# Patient Record
Sex: Female | Born: 1990 | Race: Black or African American | Hispanic: No | Marital: Single | Smoking: Never smoker
Health system: Southern US, Community
[De-identification: ages and names within clinical notes are randomized; demographics above are authoritative.]

## PROBLEM LIST (undated history)

## (undated) DIAGNOSIS — E119 Type 2 diabetes mellitus without complications: Secondary | ICD-10-CM

## (undated) DIAGNOSIS — R519 Headache, unspecified: Secondary | ICD-10-CM

## (undated) HISTORY — PX: NO PAST SURGERIES: SHX2092

---

## 2013-02-28 ENCOUNTER — Encounter: Payer: Self-pay | Admitting: Emergency Medicine

## 2013-02-28 ENCOUNTER — Emergency Department (INDEPENDENT_AMBULATORY_CARE_PROVIDER_SITE_OTHER)
Admission: EM | Admit: 2013-02-28 | Discharge: 2013-02-28 | Disposition: A | Discharge status: Home / Self Care | Payer: PRIVATE HEALTH INSURANCE | Source: Home / Self Care | Attending: Family Medicine | Admitting: Family Medicine

## 2013-02-28 DIAGNOSIS — L230 Allergic contact dermatitis due to metals: Secondary | ICD-10-CM

## 2013-02-28 MED ORDER — CLOTRIMAZOLE 1 % EX CREA
TOPICAL_CREAM | CUTANEOUS | Status: DC
Start: 2013-02-28 — End: 2018-10-10

## 2013-02-28 MED ORDER — TRIAMCINOLONE ACETONIDE 0.1 % EX CREA: TOPICAL_CREAM | CUTANEOUS | Status: DC

## 2013-02-28 MED ORDER — PREDNISONE 20 MG PO TABS: 20.0000 mg | ORAL_TABLET | Freq: Two times a day (BID) | ORAL | Status: DC

## 2013-02-28 NOTE — ED Provider Notes (Signed)
CSN: [id number]     Arrival date & time 02/28/13  1645 History   First MD Initiated Contact with Patient 02/28/13 1707     Chief Complaint  Patient presents with  . Rash        HPI Comments: Patient complains of a two day history of a scaly pruritic rash on her neck bilaterally.  She also complains of erythema of her eyelids and underneath eyelids without pain or swelling.  Patient is a 23 y.o. female presenting with rash. The history is provided by the patient.  Rash Pain location: neck and eyelids. Pain quality comment:  Itching Pain severity:  Mild Onset quality:  Gradual Duration:  2 days Timing:  Constant Progression:  Worsening Chronicity:  New Relieved by:  Nothing Worsened by:  Nothing tried Ineffective treatments:  None tried Associated symptoms: no chills and no sore throat     History reviewed. No pertinent past medical history. History reviewed. No pertinent past surgical history. Family History  Problem Relation Age of Onset  . Diabetes Maternal Uncle   . Diabetes Paternal Uncle    History  Substance Use Topics  . Smoking status: Never Smoker   . Smokeless tobacco: Not on file  . Alcohol Use: No   OB History   Grav Para Term Preterm Abortions TAB SAB Ect Mult Living                 Review of Systems  Constitutional: Negative for chills.  HENT: Negative for sore throat.   Skin: Positive for rash.      Allergies  Review of patient's allergies indicates no known allergies.  Home Medications   Current Outpatient Rx  Name  Route  Sig  Dispense  Refill  . clotrimazole (LOTRIMIN) 1 % cream      Apply thin film to eye lids BID   15 g   1   . predniSONE (DELTASONE) 20 MG tablet   Oral   Take 1 tablet (20 mg total) by mouth 2 (two) times daily. Take with food.   10 tablet   0   . triamcinolone cream (KENALOG) 0.1 %      Apply thin film to neck twice daily   30 g   1    BP 121/84  Pulse 79  Temp(Src) 98 F (36.7 C) (Oral)  Resp 16   Ht 5\' 1"  (1.549 m)  Wt 243 lb (110.224 kg)  BMI 45.94 kg/m2  SpO2 98%  LMP 02/03/2013 Physical Exam  Nursing note and vitals reviewed. Constitutional: She is oriented to person, place, and time. She appears well-developed and well-nourished. No distress.  Patient is obese (BMI 45.9)  HENT:  Head: Normocephalic and atraumatic.  Right Ear: External ear normal.  Left Ear: External ear normal.  Mouth/Throat: Oropharynx is clear and moist.  Eyes: Conjunctivae and EOM are normal. Pupils are equal, round, and reactive to light. Right eye exhibits no discharge. Left eye exhibits no discharge.    Upper and lower eyelids are slightly erythematous without swelling or tenderness.  Lower eyelids reveal an intertriginous skin fold that is moist and erythematous  Neck:    Neck reveals erythema, scaling, and hyperkeratosis in a necklace distribution as noted diagram.  Patient is wearing a gold colored chain necklace.  Cardiovascular: Normal heart sounds.   Pulmonary/Chest: Breath sounds normal.  Lymphadenopathy:    She has no cervical adenopathy.  Neurological: She is alert and oriented to person, place, and time.  Skin: Skin is  warm and dry. Rash noted.    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  Dermatitis on eyelids is suggestive of Candida     Begin prednisone burst.  Triamcinolone Cream to eruption on neck.  Clotrimazole cream to eyelids. Discontinue using necklace until resolved, then consider using necklace not containing nickle. Followup with dermatologist if not improving   Lattie HawStephen A Beese, MD 03/01/13 2235

## 2013-02-28 NOTE — ED Notes (Signed)
Reports onset of rash on face and around neck 2 days ago; no new allergens accounted for. Some itching and some pain at back of neck.

## 2013-02-28 NOTE — Discharge Instructions (Signed)
Discontinue using necklace until resolved, then consider using necklace not containing nickle.   Contact Dermatitis Contact dermatitis is a rash that happens when something touches the skin. You touched something that irritates your skin, or you have allergies to something you touched. HOME CARE   Avoid the thing that caused your rash.  Keep your rash away from hot water, soap, sunlight, chemicals, and other things that might bother it.  Do not scratch your rash.  You can take cool baths to help stop itching.  Only take medicine as told by your doctor.  Keep all doctor visits as told. GET HELP RIGHT AWAY IF:   Your rash is not better after 3 days.  Your rash gets worse.  Your rash is puffy (swollen), tender, red, sore, or warm.  You have problems with your medicine. MAKE SURE YOU:   Understand these instructions.  Will watch your condition.  Will get help right away if you are not doing well or get worse. Document Released: 10/29/2008 Document Revised: 03/26/2011 Document Reviewed: 06/06/2010 [facility] Patient Information 2014 [employer], [place].

## 2018-10-10 ENCOUNTER — Inpatient Hospital Stay (HOSPITAL_COMMUNITY)
Admission: AD | Admit: 2018-10-10 | Discharge: 2018-10-10 | Disposition: A | Discharge status: Home / Self Care | Payer: Medicaid Other | Attending: Obstetrics and Gynecology | Admitting: Obstetrics and Gynecology

## 2018-10-10 ENCOUNTER — Encounter (HOSPITAL_COMMUNITY): Payer: Self-pay | Admitting: *Deleted

## 2018-10-10 ENCOUNTER — Other Ambulatory Visit: Payer: Self-pay

## 2018-10-10 ENCOUNTER — Other Ambulatory Visit: Payer: Self-pay | Admitting: Certified Nurse Midwife

## 2018-10-10 DIAGNOSIS — Z833 Family history of diabetes mellitus: Secondary | ICD-10-CM | POA: Insufficient documentation

## 2018-10-10 DIAGNOSIS — R109 Unspecified abdominal pain: Secondary | ICD-10-CM | POA: Diagnosis not present

## 2018-10-10 DIAGNOSIS — Z3A10 10 weeks gestation of pregnancy: Secondary | ICD-10-CM | POA: Diagnosis not present

## 2018-10-10 DIAGNOSIS — O26891 Other specified pregnancy related conditions, first trimester: Secondary | ICD-10-CM | POA: Diagnosis not present

## 2018-10-10 DIAGNOSIS — O4690 Antepartum hemorrhage, unspecified, unspecified trimester: Secondary | ICD-10-CM

## 2018-10-10 DIAGNOSIS — O26899 Other specified pregnancy related conditions, unspecified trimester: Secondary | ICD-10-CM

## 2018-10-10 DIAGNOSIS — O360910 Maternal care for other rhesus isoimmunization, first trimester, not applicable or unspecified: Secondary | ICD-10-CM

## 2018-10-10 DIAGNOSIS — O4691 Antepartum hemorrhage, unspecified, first trimester: Secondary | ICD-10-CM | POA: Diagnosis not present

## 2018-10-10 DIAGNOSIS — N841 Polyp of cervix uteri: Secondary | ICD-10-CM | POA: Diagnosis not present

## 2018-10-10 DIAGNOSIS — Z6790 Unspecified blood type, Rh positive: Secondary | ICD-10-CM | POA: Diagnosis not present

## 2018-10-10 DIAGNOSIS — O3441 Maternal care for other abnormalities of cervix, first trimester: Secondary | ICD-10-CM | POA: Diagnosis not present

## 2018-10-10 HISTORY — DX: Headache, unspecified: R51.9

## 2018-10-10 LAB — URINALYSIS, ROUTINE W REFLEX MICROSCOPIC
Bilirubin Urine: NEGATIVE
Glucose, UA: NEGATIVE mg/dL
Ketones, ur: 20 mg/dL — AB
Nitrite: NEGATIVE
Protein, ur: 30 mg/dL — AB
Specific Gravity, Urine: 1.02 (ref 1.005–1.030)
pH: 7 (ref 5.0–8.0)

## 2018-10-10 LAB — WET PREP, GENITAL
Clue Cells Wet Prep HPF POC: NONE SEEN
Sperm: NONE SEEN
Trich, Wet Prep: NONE SEEN
Yeast Wet Prep HPF POC: NONE SEEN

## 2018-10-10 LAB — CBC
HCT: 37.5 % (ref 36.0–46.0)
Hemoglobin: 12.5 g/dL (ref 12.0–15.0)
MCH: 28.8 pg (ref 26.0–34.0)
MCHC: 33.3 g/dL (ref 30.0–36.0)
MCV: 86.4 fL (ref 80.0–100.0)
Platelets: 245 10*3/uL (ref 150–400)
RBC: 4.34 MIL/uL (ref 3.87–5.11)
RDW: 13 % (ref 11.5–15.5)
WBC: 6.7 10*3/uL (ref 4.0–10.5)
nRBC: 0 % (ref 0.0–0.2)

## 2018-10-10 LAB — ABO/RH: ABO/RH(D): O POS

## 2018-10-10 MED ORDER — ACETAMINOPHEN 500 MG PO TABS: 1000.0000 mg | ORAL_TABLET | Freq: Four times a day (QID) | ORAL | Status: DC | PRN

## 2018-10-10 NOTE — MAU Provider Note (Addendum)
History     CSN: [id number]  Arrival date and time: 10/10/18 1054   First Provider Initiated Contact with Patient 10/10/18 1132      Chief Complaint  Patient presents with  . Vaginal Bleeding  . Abdominal Pain   28 y.o. G1 @10 .3 wks presenting with VB. Reports onset around 3am when she got up to BR. She noticed blood streaks in her underwear. Bleeding has not been enough to require a pad. No recent sex. Denies vaginal discharge, itching, or odor. Also reports sharp, constant, bilateral low abd pain since 3am. Rates 5/10. Denies urinary sx. Had constipation and vomiting yesterday. Morning sickness had been ongoing for her.   OB History    Gravida  1   Para      Term      Preterm      AB      Living        SAB      TAB      Ectopic      Multiple      Live Births              Past Medical History:  Diagnosis Date  . Headache    Migraines    Past Surgical History:  Procedure Laterality Date  . NO PAST SURGERIES      Family History  Problem Relation Age of Onset  . Diabetes Maternal Uncle   . Diabetes Paternal Uncle     Social History   Tobacco Use  . Smoking status: Never Smoker  . Smokeless tobacco: Never Used  Substance Use Topics  . Alcohol use: No  . Drug use: No    Allergies: No Known Allergies  No medications prior to admission.    Review of Systems  Constitutional: Negative for chills and fever.  Gastrointestinal: Positive for abdominal pain, constipation, nausea and vomiting. Negative for diarrhea.  Genitourinary: Positive for vaginal bleeding. Negative for dysuria, frequency, urgency and vaginal discharge.   Physical Exam   Blood pressure 116/68, pulse 88, temperature 98.9 F (37.2 C), temperature source Oral, resp. rate 17, height 5\' 3"  (1.6 m), weight 102.1 kg, SpO2 100 %.  Physical Exam  Nursing note and vitals reviewed. Constitutional: She appears well-developed and well-nourished. No distress (appears comfortable).   HENT:  Head: Normocephalic and atraumatic.  Neck: Normal range of motion.  Cardiovascular: Normal rate.  Respiratory: Effort normal.  GI: Soft. She exhibits no distension and no mass. There is no abdominal tenderness. There is no rebound and no guarding.  Genitourinary:    Genitourinary Comments: External: no lesions or erythema Vagina: rugated, pink, moist, scant pink discharge Cervix closed/long; polyp @ 9 o'clock    FHT 172   Results for orders placed or performed during the hospital encounter of 10/10/18 (from the past 24 hour(s))  Urinalysis, Routine w reflex microscopic     Status: Abnormal   Collection Time: 10/10/18 11:23 AM  Result Value Ref Range   Color, Urine YELLOW YELLOW   APPearance CLOUDY (A) CLEAR   Specific Gravity, Urine 1.020 1.005 - 1.030   pH 7.0 5.0 - 8.0   Glucose, UA NEGATIVE NEGATIVE mg/dL   Hgb urine dipstick MODERATE (A) NEGATIVE   Bilirubin Urine NEGATIVE NEGATIVE   Ketones, ur 20 (A) NEGATIVE mg/dL   Protein, ur 30 (A) NEGATIVE mg/dL   Nitrite NEGATIVE NEGATIVE   Leukocytes,Ua SMALL (A) NEGATIVE   RBC / HPF 0-5 0 - 5 RBC/hpf   WBC, UA 21-50 0 -  5 WBC/hpf   Bacteria, UA RARE (A) NONE SEEN   Squamous Epithelial / LPF 21-50 0 - 5   Mucus PRESENT   Wet prep, genital     Status: Abnormal   Collection Time: 10/10/18 11:40 AM   Specimen: Cervix  Result Value Ref Range   Yeast Wet Prep HPF POC NONE SEEN NONE SEEN   Trich, Wet Prep NONE SEEN NONE SEEN   Clue Cells Wet Prep HPF POC NONE SEEN NONE SEEN   WBC, Wet Prep HPF POC FEW (A) NONE SEEN   Sperm NONE SEEN   ABO/Rh     Status: None   Collection Time: 10/10/18 11:43 AM  Result Value Ref Range   ABO/RH(D) O POS    No rh immune globuloin      NOT A RH IMMUNE GLOBULIN CANDIDATE, PT RH POSITIVE Performed at [facility], [place] [address] [place], [place] [address]   CBC     Status: None   Collection Time: 10/10/18 11:43 AM  Result Value Ref Range   WBC 6.7 4.0 - 10.5 K/uL   RBC 4.34 3.87  - 5.11 MIL/uL   Hemoglobin 12.5 12.0 - 15.0 g/dL   HCT 37.5 36.0 - 46.0 %   MCV 86.4 80.0 - 100.0 fL   MCH 28.8 26.0 - 34.0 pg   MCHC 33.3 30.0 - 36.0 g/dL   RDW 13.0 11.5 - 15.5 %   Platelets 245 150 - 400 K/uL   nRBC 0.0 0.0 - 0.2 %   MAU Course  Procedures Tylenol  MDM Labs ordered and reviewed. No evidence of impending SAB. Viability confirmed by doppler. Spotting likely from polyp. Stable for discharge home.  Assessment and Plan   1. [redacted] weeks gestation of pregnancy   2. Vaginal bleeding in pregnancy   3. Blood type, Rh positive   4. Abdominal pain affecting pregnancy   5. Cervical polyp    Discharge home Follow up at [facility] in 3 days as scheduled SAB precautions Pelvic rest  Allergies as of 10/10/2018   No Known Allergies     Medication List    STOP taking these medications   clotrimazole 1 % cream Commonly known as: LOTRIMIN   predniSONE 20 MG tablet Commonly known as: DELTASONE   triamcinolone cream 0.1 % Commonly known as: KENALOG     TAKE these medications   prenatal vitamin w/FE, FA 27-1 MG Tabs tablet Take 1 tablet by mouth daily at 12 noon.      Julianne Handler, CNM 10/10/2018, 12:46 PM

## 2018-10-10 NOTE — Discharge Instructions (Signed)
Vaginal Bleeding During Pregnancy, First Trimester ° °A small amount of bleeding (spotting) from the vagina is common during early pregnancy. Sometimes the bleeding is normal and does not cause problems. At other times, though, bleeding may be a sign of something serious. Tell your doctor about any bleeding from your vagina right away. °Follow these instructions at home: °Activity °· Follow your doctor's instructions about how active you can be. °· If needed, make plans for someone to help with your normal activities. °· Do not have sex or orgasms until your doctor says that this is safe. °General instructions °· Take over-the-counter and prescription medicines only as told by your doctor. °· Watch your condition for any changes. °· Write down: °? The number of pads you use each day. °? How often you change pads. °? How soaked (saturated) your pads are. °· Do not use tampons. °· Do not douche. °· If you pass any tissue from your vagina, save it to show to your doctor. °· Keep all follow-up visits as told by your doctor. This is important. °Contact a doctor if: °· You have vaginal bleeding at any time while you are pregnant. °· You have cramps. °· You have a fever. °Get help right away if: °· You have very bad cramps in your back or belly (abdomen). °· You pass large clots or a lot of tissue from your vagina. °· Your bleeding gets worse. °· You feel light-headed. °· You feel weak. °· You pass out (faint). °· You have chills. °· You are leaking fluid from your vagina. °· You have a gush of fluid from your vagina. °Summary °· Sometimes vaginal bleeding during pregnancy is normal and does not cause problems. At other times, bleeding may be a sign of something serious. °· Tell your doctor about any bleeding from your vagina right away. °· Follow your doctor's instructions about how active you can be. You may need someone to help you with your normal activities. °This information is not intended to replace advice given to  you by your health care provider. Make sure you discuss any questions you have with your health care provider. °Document Released: 05/18/2013 Document Revised: 04/22/2018 Document Reviewed: 04/04/2016 °Elsevier Patient Education © 2020 Elsevier Inc. ° °

## 2018-10-10 NOTE — MAU Note (Signed)
When she woke up this morning, she had what looked like a moderate period, still seeing a little when she wipes. Having a little sharp pains in the bottoms. Has had bleeding before, was told "implantation".  Has been seen in office, has had [place].

## 2018-10-11 LAB — CERVICOVAGINAL ANCILLARY ONLY
Chlamydia: NEGATIVE
Neisseria Gonorrhea: NEGATIVE

## 2018-10-13 LAB — OB RESULTS CONSOLE ANTIBODY SCREEN: Antibody Screen: NEGATIVE

## 2018-10-13 LAB — OB RESULTS CONSOLE GC/CHLAMYDIA
Chlamydia: NEGATIVE
Gonorrhea: NEGATIVE

## 2018-10-13 LAB — OB RESULTS CONSOLE ABO/RH: RH Type: POSITIVE

## 2018-10-13 LAB — OB RESULTS CONSOLE HEPATITIS B SURFACE ANTIGEN: Hepatitis B Surface Ag: NEGATIVE

## 2018-10-13 LAB — OB RESULTS CONSOLE RPR: RPR: NONREACTIVE

## 2018-10-13 LAB — OB RESULTS CONSOLE HIV ANTIBODY (ROUTINE TESTING): HIV: NONREACTIVE

## 2018-10-13 LAB — OB RESULTS CONSOLE RUBELLA ANTIBODY, IGM: Rubella: IMMUNE

## 2018-10-13 LAB — OB RESULTS CONSOLE GBS: GBS: POSITIVE

## 2019-01-16 NOTE — L&D Delivery Note (Addendum)
Delivery Note Ashley Santana is a G1P0 at [redacted]w[redacted]d who had a spontaneous delivery at 18:44 on 04/29/19  a viable female was delivered via OA.  APGAR:  8, 9 ; weight 3184g (7lb0.3oz)  Admitted for elective IOL at [redacted]w[redacted]d. On admit she was 2.5cm, received cytotec x1, continued to contract spontaneously and was AROM'ed. She then had a protracted active phase at 7cm, but then progressed normally. Pushed for 30 minutes, then a 30 minute break and then another 14 minutes. Baby was delivered without difficulty. One loose nuchal cord easily reduced. Pediatrics present at delivery. Delayed cord clamping for 60 seconds on mom's abdomen. Delivery of placenta was spontaneous. Placenta was found to be intact, 3 -vessel cord was noted. The fundus was found to be firm. Bilateral periurethral lacerations and 2nd degree repaired with 2-0 vicryl rapide. She continued to have brisk bleeding, methergine and hemabate given. Bleeding improved and lower uterine segment cleared of clot x2. Red robin used to relieve the bladder. Cervix examined and intact. DRE with good rectal tone and no sutures. Estimated blood loss 660cc. Instrument and gauze counts were correct at the end of the procedure. Received epidural for pain management   Placenta status: to L&D Mom to postpartum.  Baby to Couplet care / Skin to Skin.  Ashley Santana 04/29/2019, 7:16 PM

## 2019-02-06 ENCOUNTER — Other Ambulatory Visit: Payer: Self-pay

## 2019-02-06 ENCOUNTER — Inpatient Hospital Stay (HOSPITAL_COMMUNITY)
Admission: AD | Admit: 2019-02-06 | Discharge: 2019-02-06 | Disposition: A | Discharge status: Home / Self Care | Payer: Medicaid Other | Attending: Obstetrics and Gynecology | Admitting: Obstetrics and Gynecology

## 2019-02-06 ENCOUNTER — Encounter (HOSPITAL_COMMUNITY): Payer: Self-pay | Admitting: Obstetrics and Gynecology

## 2019-02-06 DIAGNOSIS — O98812 Other maternal infectious and parasitic diseases complicating pregnancy, second trimester: Secondary | ICD-10-CM | POA: Diagnosis not present

## 2019-02-06 DIAGNOSIS — O98811 Other maternal infectious and parasitic diseases complicating pregnancy, first trimester: Secondary | ICD-10-CM | POA: Diagnosis not present

## 2019-02-06 DIAGNOSIS — M545 Low back pain: Secondary | ICD-10-CM | POA: Insufficient documentation

## 2019-02-06 DIAGNOSIS — R319 Hematuria, unspecified: Secondary | ICD-10-CM | POA: Diagnosis not present

## 2019-02-06 DIAGNOSIS — Z3A27 27 weeks gestation of pregnancy: Secondary | ICD-10-CM | POA: Diagnosis not present

## 2019-02-06 DIAGNOSIS — M549 Dorsalgia, unspecified: Secondary | ICD-10-CM | POA: Insufficient documentation

## 2019-02-06 DIAGNOSIS — O26892 Other specified pregnancy related conditions, second trimester: Secondary | ICD-10-CM | POA: Diagnosis not present

## 2019-02-06 DIAGNOSIS — N898 Other specified noninflammatory disorders of vagina: Secondary | ICD-10-CM | POA: Insufficient documentation

## 2019-02-06 DIAGNOSIS — Z79899 Other long term (current) drug therapy: Secondary | ICD-10-CM | POA: Diagnosis not present

## 2019-02-06 DIAGNOSIS — Z7982 Long term (current) use of aspirin: Secondary | ICD-10-CM | POA: Insufficient documentation

## 2019-02-06 DIAGNOSIS — B379 Candidiasis, unspecified: Secondary | ICD-10-CM | POA: Diagnosis not present

## 2019-02-06 LAB — URINALYSIS, COMPLETE (UACMP) WITH MICROSCOPIC
Bilirubin Urine: NEGATIVE
Glucose, UA: NEGATIVE mg/dL
Ketones, ur: 20 mg/dL — AB
Nitrite: NEGATIVE
Protein, ur: 100 mg/dL — AB
RBC / HPF: >50 RBC/hpf — ABNORMAL HIGH (ref 0–5)
Specific Gravity, Urine: 1.021 (ref 1.005–1.030)
pH: 6 (ref 5.0–8.0)

## 2019-02-06 LAB — WET PREP, GENITAL
Clue Cells Wet Prep HPF POC: NONE SEEN
Sperm: NONE SEEN
Trich, Wet Prep: NONE SEEN

## 2019-02-06 MED ORDER — CYCLOBENZAPRINE HCL 10 MG PO TABS
10.0000 mg | ORAL_TABLET | Freq: Once | ORAL | Status: AC
  Administered 2019-02-06: 18:00:00 10 mg via ORAL
  Filled 2019-02-06: qty 1

## 2019-02-06 MED ORDER — CYCLOBENZAPRINE HCL 10 MG PO TABS: 10.0000 mg | ORAL_TABLET | Freq: Two times a day (BID) | ORAL | 0 refills | Status: DC | PRN

## 2019-02-06 MED ORDER — TERCONAZOLE 0.4 % VA CREA: 1.0000 | TOPICAL_CREAM | Freq: Every day | VAGINAL | 0 refills | Status: DC

## 2019-02-06 NOTE — Discharge Instructions (Signed)
Vaginal Yeast Infection, Adult  Vaginal yeast infection is a condition that causes vaginal discharge as well as soreness, swelling, and redness (inflammation) of the vagina. This is a common condition. Some women get this infection frequently. What are the causes? This condition is caused by a change in the normal balance of the yeast (candida) and bacteria that live in the vagina. This change causes an overgrowth of yeast, which causes the inflammation. What increases the risk? The condition is more likely to develop in women who:  Take antibiotic medicines.  Have diabetes.  Take birth control pills.  Are pregnant.  Douche often.  Have a weak body defense system (immune system).  Have been taking steroid medicines for a long time.  Frequently wear tight clothing. What are the signs or symptoms? Symptoms of this condition include:  White, thick, creamy vaginal discharge.  Swelling, itching, redness, and irritation of the vagina. The lips of the vagina (vulva) may be affected as well.  Pain or a burning feeling while urinating.  Pain during sex. How is this diagnosed? This condition is diagnosed based on:  Your medical history.  A physical exam.  A pelvic exam. Your health care provider will examine a sample of your vaginal discharge under a microscope. Your health care provider may send this sample for testing to confirm the diagnosis. How is this treated? This condition is treated with medicine. Medicines may be over-the-counter or prescription. You may be told to use one or more of the following:  Medicine that is taken by mouth (orally).  Medicine that is applied as a cream (topically).  Medicine that is inserted directly into the vagina (suppository). Follow these instructions at home:  Lifestyle  Do not have sex until your health care provider approves. Tell your sex partner that you have a yeast infection. That person should go to his or her health care  provider and ask if they should also be treated.  Do not wear tight clothes, such as pantyhose or tight pants.  Wear breathable cotton underwear. General instructions  Take or apply over-the-counter and prescription medicines only as told by your health care provider.  Eat more yogurt. This may help to keep your yeast infection from returning.  Do not use tampons until your health care provider approves.  Try taking a sitz bath to help with discomfort. This is a warm water bath that is taken while you are sitting down. The water should only come up to your hips and should cover your buttocks. Do this 3-4 times per day or as told by your health care provider.  Do not douche.  If you have diabetes, keep your blood sugar levels under control.  Keep all follow-up visits as told by your health care provider. This is important. Contact a health care provider if:  You have a fever.  Your symptoms go away and then return.  Your symptoms do not get better with treatment.  Your symptoms get worse.  You have new symptoms.  You develop blisters in or around your vagina.  You have blood coming from your vagina and it is not your menstrual period.  You develop pain in your abdomen. Summary  Vaginal yeast infection is a condition that causes discharge as well as soreness, swelling, and redness (inflammation) of the vagina.  This condition is treated with medicine. Medicines may be over-the-counter or prescription.  Take or apply over-the-counter and prescription medicines only as told by your health care provider.  Do not douche.   Do not have sex or use tampons until your health care provider approves.  Contact a health care provider if your symptoms do not get better with treatment or your symptoms go away and then return. This information is not intended to replace advice given to you by your health care provider. Make sure you discuss any questions you have with your health care  provider. Document Revised: 08/01/2018 Document Reviewed: 05/20/2017 Elsevier Patient Education  2020 Elsevier Inc.  

## 2019-02-06 NOTE — MAU Provider Note (Signed)
Patient Ashley Santana is a 29 y.o. G1P0 at [redacted]w[redacted]d here for further evaluation after a nurse visit at [facility]. Patient reports two or three episodes of pink spotting yesterday on her toilet paper; she thought perhaps it was a UTI and so she called her OB. She also had some lower back pain. Per Patient, urine sample at [facility] this afternoon was "clear" so she was sent to MAU for further work-up for a "UTI". She did not have any bleeding this morning; it was only yesterday.  She denies recent intercourse, decreased fetal movements, loss of fluid, other vaginal bleeding. She has had an uncomplicated pregnancy thus far.    History     CSN: [id number]  Arrival date and time: 02/06/19 1062   First Provider Initiated Contact with Patient 02/06/19 1728      Chief Complaint  Patient presents with  . Back Pain  . Hematuria   Vaginal Bleeding The patient's primary symptoms include vaginal bleeding. The patient's pertinent negatives include no pelvic pain. The current episode started yesterday. The problem has been resolved. Associated symptoms include back pain, discolored urine and hematuria. Pertinent negatives include no constipation, diarrhea, dysuria, fever, flank pain, frequency, nausea or urgency. The vaginal discharge was bloody. The vaginal bleeding is spotting. She has not been passing clots (one small clot in the middle of the night).  Back Pain This is a new problem. The current episode started yesterday. The problem occurs constantly. The pain is present in the lumbar spine. The quality of the pain is described as aching. The pain does not radiate. The pain is at a severity of 6/10. Pertinent negatives include no dysuria, fever or pelvic pain. She has tried nothing for the symptoms.    OB History     Gravida  1   Para      Term      Preterm      AB      Living         SAB      TAB      Ectopic      Multiple      Live Births              Past  Medical History:  Diagnosis Date  . Headache    Migraines    Past Surgical History:  Procedure Laterality Date  . NO PAST SURGERIES      Family History  Problem Relation Age of Onset  . Diabetes Maternal Uncle   . Diabetes Paternal Uncle     Social History   Tobacco Use  . Smoking status: Never Smoker  . Smokeless tobacco: Never Used  Substance Use Topics  . Alcohol use: No  . Drug use: No    Allergies: No Known Allergies  Medications Prior to Admission  Medication Sig Dispense Refill Last Dose  . aspirin 81 MG chewable tablet Chew 81 mg by mouth daily.   02/06/2019 at Unknown time  . doxylamine, Sleep, (UNISOM) 25 MG tablet Take 25 mg by mouth at bedtime as needed.   02/06/2019 at Unknown time  . famotidine (PEPCID) 20 MG tablet Take 20 mg by mouth 2 (two) times daily.   02/06/2019 at Unknown time  . prenatal vitamin w/FE, FA (PRENATAL 1 + 1) 27-1 MG TABS tablet Take 1 tablet by mouth daily at 12 noon.   02/06/2019 at Unknown time  . pyridOXINE (VITAMIN B-6) 100 MG tablet Take 100 mg by mouth daily.  02/06/2019 at Unknown time  . senna (SENOKOT) 176 MG/5ML SYRP Take 5 mLs by mouth.   02/06/2019 at Unknown time    Review of Systems  Constitutional: Negative for fever.  Eyes: Negative.   Respiratory: Negative.   Cardiovascular: Negative.   Gastrointestinal: Negative for constipation, diarrhea and nausea.  Endocrine: Negative.   Genitourinary: Positive for hematuria and vaginal bleeding. Negative for dysuria, flank pain, frequency, pelvic pain and urgency.  Musculoskeletal: Positive for back pain.   Physical Exam   Blood pressure 134/75, pulse 97, temperature 98.1 F (36.7 C), temperature source Oral, resp. rate 18, height 5\' 3"  (1.6 m), weight 104.8 kg, SpO2 100 %.  Physical Exam  Constitutional: She appears well-developed.  HENT:  Head: Normocephalic.  Eyes: Pupils are equal, round, and reactive to light.  Respiratory: Effort normal.  GI: Soft.  Genitourinary:     Genitourinary Comments: NEFG; no blood in the vagina, trace white clumpy vaginal discharge,  no CMT, suprapubic or adnexal tenderness. Cervix is long, closed and thick.    Musculoskeletal:        General: Normal range of motion.     Cervical back: Normal range of motion.  Neurological: She is alert.  Skin: Skin is warm and dry.  Psychiatric: She has a normal mood and affect.  NO CVA Tenderness.   MAU Course  Procedures  MDM NST 150 bpm, mod var, present acel, neg decels, no contractions Wet prep: positive for yeast Feeling much better after flexeril; pain in back is gone.  GC CT test pending  Urine for culture due to hematuria Assessment and Plan   1. Yeast infection    2. Explained to patient that back pain in the middle of lower back is common pregnancy and can be relieved with tylenol, rest and heat.  3. The vaginal bleeding is not continuing, so that this point she is safe for discharge with a reassuring NST and no contractions. If bleeding continues, she should return to MAU. VB most likely physiological changes in pregnancy.  4. Rx for Flexeril and Terazole given 5. Ob return precautions given 6. MAU note sent to [facility].   Mervyn Skeeters Chantee Cerino 02/06/2019, 5:49 PM

## 2019-02-06 NOTE — MAU Note (Signed)
Saw pink when she wiped after urination, having lower back pain.  Went to office, urine was checked, was told was ok, told still needed to come here since she is 27 wks.

## 2019-02-08 ENCOUNTER — Other Ambulatory Visit: Payer: Self-pay

## 2019-02-08 ENCOUNTER — Encounter (HOSPITAL_COMMUNITY): Payer: Self-pay | Admitting: Obstetrics and Gynecology

## 2019-02-08 ENCOUNTER — Inpatient Hospital Stay (HOSPITAL_COMMUNITY)
Admission: AD | Admit: 2019-02-08 | Discharge: 2019-02-08 | Disposition: A | Discharge status: Home / Self Care | Payer: Medicaid Other | Attending: Obstetrics and Gynecology | Admitting: Obstetrics and Gynecology

## 2019-02-08 DIAGNOSIS — Z7982 Long term (current) use of aspirin: Secondary | ICD-10-CM | POA: Insufficient documentation

## 2019-02-08 DIAGNOSIS — Z3A27 27 weeks gestation of pregnancy: Secondary | ICD-10-CM | POA: Diagnosis not present

## 2019-02-08 DIAGNOSIS — W19XXXA Unspecified fall, initial encounter: Secondary | ICD-10-CM | POA: Insufficient documentation

## 2019-02-08 DIAGNOSIS — O9A212 Injury, poisoning and certain other consequences of external causes complicating pregnancy, second trimester: Secondary | ICD-10-CM | POA: Diagnosis not present

## 2019-02-08 DIAGNOSIS — R55 Syncope and collapse: Secondary | ICD-10-CM | POA: Diagnosis not present

## 2019-02-08 DIAGNOSIS — R519 Headache, unspecified: Secondary | ICD-10-CM | POA: Diagnosis not present

## 2019-02-08 DIAGNOSIS — R42 Dizziness and giddiness: Secondary | ICD-10-CM | POA: Diagnosis not present

## 2019-02-08 DIAGNOSIS — Z79899 Other long term (current) drug therapy: Secondary | ICD-10-CM | POA: Diagnosis not present

## 2019-02-08 DIAGNOSIS — Z3689 Encounter for other specified antenatal screening: Secondary | ICD-10-CM

## 2019-02-08 DIAGNOSIS — O26892 Other specified pregnancy related conditions, second trimester: Secondary | ICD-10-CM | POA: Insufficient documentation

## 2019-02-08 LAB — BASIC METABOLIC PANEL
Anion gap: 8 (ref 5–15)
BUN: 5 mg/dL — ABNORMAL LOW (ref 6–20)
CO2: 21 mmol/L — ABNORMAL LOW (ref 22–32)
Calcium: 9.2 mg/dL (ref 8.9–10.3)
Chloride: 105 mmol/L (ref 98–111)
Creatinine, Ser: 0.74 mg/dL (ref 0.44–1.00)
GFR calc Af Amer: >60 mL/min (ref >60)
GFR calc non Af Amer: >60 mL/min (ref >60)
Glucose, Bld: 92 mg/dL (ref 70–99)
Potassium: 4 mmol/L (ref 3.5–5.1)
Sodium: 134 mmol/L — ABNORMAL LOW (ref 135–145)

## 2019-02-08 LAB — CBC
HCT: 34.1 % — ABNORMAL LOW (ref 36.0–46.0)
Hemoglobin: 11.2 g/dL — ABNORMAL LOW (ref 12.0–15.0)
MCH: 28.2 pg (ref 26.0–34.0)
MCHC: 32.8 g/dL (ref 30.0–36.0)
MCV: 85.9 fL (ref 80.0–100.0)
Platelets: 203 10*3/uL (ref 150–400)
RBC: 3.97 MIL/uL (ref 3.87–5.11)
RDW: 13.3 % (ref 11.5–15.5)
WBC: 9 10*3/uL (ref 4.0–10.5)
nRBC: 0 % (ref 0.0–0.2)

## 2019-02-08 LAB — URINALYSIS, ROUTINE W REFLEX MICROSCOPIC
Bilirubin Urine: NEGATIVE
Glucose, UA: NEGATIVE mg/dL
Ketones, ur: NEGATIVE mg/dL
Nitrite: NEGATIVE
Protein, ur: 30 mg/dL — AB
RBC / HPF: >50 RBC/hpf — ABNORMAL HIGH (ref 0–5)
Specific Gravity, Urine: 1.018 (ref 1.005–1.030)
Squamous Epithelial / LPF: >50 — ABNORMAL HIGH (ref 0–5)
pH: 6 (ref 5.0–8.0)

## 2019-02-08 LAB — CULTURE, OB URINE

## 2019-02-08 MED ORDER — ACETAMINOPHEN 500 MG PO TABS
1000.0000 mg | ORAL_TABLET | Freq: Four times a day (QID) | ORAL | Status: DC | PRN
  Administered 2019-02-08: 14:00:00 1000 mg via ORAL
  Filled 2019-02-08: qty 2

## 2019-02-08 NOTE — MAU Note (Signed)
Ashley Santana is a 29 y.o. at [redacted]w[redacted]d here in MAU reporting: was making breakfast and states after she put the food on the table she passed out onto the floor. States mom witnessed the event and told the patient that she hit her side and then rolled onto her back. Unsure if she hit her head but states the back of her head feels tender.This occurred around 1015. No abdominal pain, VB, or LOF. Unsure about FM.   Onset of complaint: today around 1015  Pain score: 8/10  Vitals:   02/08/19 1305  BP: 113/68  Pulse: 99  Resp: 17  Temp: 98.1 F (36.7 C)  SpO2: 98%     FHT:144  Lab orders placed from triage: UA

## 2019-02-08 NOTE — MAU Provider Note (Signed)
History     CSN: [id number]  Arrival date and time: 02/08/19 1244   First Provider Initiated Contact with Patient 02/08/19 1332      Chief Complaint  Patient presents with  . Loss of Consciousness  . Headache   28 y.o. G1 @27 .5 wks presenting after syncope episode around 10 am. Reports feeling lightheaded immediately prior to passing out while standing in her kitchen preparing food. Her mother witnessed the event and reports pt falling down on her left side and rolling onto her back. The pt thinks she hit the back of her head. Reports soreness in the upper occipital region. Denies laceration or bleeding. Rates pain 8/10. Has not taken anything for it. No N/V. No memory loss, can recall her events since waking around 7am. Denies VB, abd pain, ctx, and LOF. Reports good FM. Reports not eating or drinking since last night.   OB History    Gravida  1   Para      Term      Preterm      AB      Living        SAB      TAB      Ectopic      Multiple      Live Births              Past Medical History:  Diagnosis Date  . Headache    Migraines    Past Surgical History:  Procedure Laterality Date  . NO PAST SURGERIES      Family History  Problem Relation Age of Onset  . Diabetes Maternal Uncle   . Diabetes Paternal Uncle     Social History   Tobacco Use  . Smoking status: Never Smoker  . Smokeless tobacco: Never Used  Substance Use Topics  . Alcohol use: No  . Drug use: No    Allergies: No Known Allergies  Medications Prior to Admission  Medication Sig Dispense Refill Last Dose  . aspirin 81 MG chewable tablet Chew 81 mg by mouth daily.   02/08/2019 at Unknown time  . doxylamine, Sleep, (UNISOM) 25 MG tablet Take 25 mg by mouth at bedtime as needed.   02/08/2019 at Unknown time  . famotidine (PEPCID) 20 MG tablet Take 20 mg by mouth 2 (two) times daily.   02/08/2019 at Unknown time  . prenatal vitamin w/FE, FA (PRENATAL 1 + 1) 27-1 MG TABS tablet Take  1 tablet by mouth daily at 12 noon.   02/08/2019 at Unknown time  . pyridOXINE (VITAMIN B-6) 100 MG tablet Take 100 mg by mouth daily.   02/08/2019 at Unknown time  . senna (SENOKOT) 176 MG/5ML SYRP Take 5 mLs by mouth.   02/08/2019 at Unknown time  . cyclobenzaprine (FLEXERIL) 10 MG tablet Take 1 tablet (10 mg total) by mouth 2 (two) times daily as needed for muscle spasms. 90 tablet 0   . terconazole (TERAZOL 7) 0.4 % vaginal cream Place 1 applicator vaginally at bedtime. 45 g 0     Review of Systems  Constitutional: Negative for chills and fever.  Respiratory: Negative for shortness of breath.   Cardiovascular: Negative for chest pain.  Gastrointestinal: Negative for abdominal pain.  Genitourinary: Negative for vaginal bleeding and vaginal discharge.  Neurological: Positive for syncope, weakness and light-headedness.   Physical Exam   Blood pressure 113/68, pulse 99, temperature 98.1 F (36.7 C), temperature source Oral, resp. rate 17, height 5\' 3"  (1.6 m), weight 104  kg, SpO2 98 %.  Physical Exam  Nursing note and vitals reviewed. Constitutional: She is oriented to person, place, and time. She appears well-developed and well-nourished. No distress.  HENT:  Head: Normocephalic and atraumatic. Head is without raccoon's eyes, without Battle's sign, without abrasion, without contusion, without laceration, without right periorbital erythema and without left periorbital erythema.    Eyes: Pupils are equal, round, and reactive to light.  Cardiovascular: Normal rate.  Respiratory: Effort normal. No respiratory distress.  GI: Soft. She exhibits no distension. There is no abdominal tenderness.  gravid  Musculoskeletal:        General: Normal range of motion.     Cervical back: Normal range of motion.  Neurological: She is alert and oriented to person, place, and time. She has normal strength and normal reflexes. No cranial nerve deficit. Coordination normal. GCS eye subscore is 4. GCS  verbal subscore is 5. GCS motor subscore is 6.  Skin: Skin is warm and dry.  Psychiatric: She has a normal mood and affect.  EFM: 145 bpm, mod variability, + accels, no decels Toco: irritability  Results for orders placed or performed during the hospital encounter of 02/08/19 (from the past 24 hour(s))  Urinalysis, Routine w reflex microscopic     Status: Abnormal   Collection Time: 02/08/19  1:30 PM  Result Value Ref Range   Color, Urine YELLOW YELLOW   APPearance CLOUDY (A) CLEAR   Specific Gravity, Urine 1.018 1.005 - 1.030   pH 6.0 5.0 - 8.0   Glucose, UA NEGATIVE NEGATIVE mg/dL   Hgb urine dipstick LARGE (A) NEGATIVE   Bilirubin Urine NEGATIVE NEGATIVE   Ketones, ur NEGATIVE NEGATIVE mg/dL   Protein, ur 30 (A) NEGATIVE mg/dL   Nitrite NEGATIVE NEGATIVE   Leukocytes,Ua LARGE (A) NEGATIVE   RBC / HPF >50 (H) 0 - 5 RBC/hpf   WBC, UA 21-50 0 - 5 WBC/hpf   Bacteria, UA RARE (A) NONE SEEN   Squamous Epithelial / LPF >50 (H) 0 - 5   Mucus PRESENT   CBC     Status: Abnormal   Collection Time: 02/08/19  2:17 PM  Result Value Ref Range   WBC 9.0 4.0 - 10.5 K/uL   RBC 3.97 3.87 - 5.11 MIL/uL   Hemoglobin 11.2 (L) 12.0 - 15.0 g/dL   HCT 34.1 (L) 36.0 - 46.0 %   MCV 85.9 80.0 - 100.0 fL   MCH 28.2 26.0 - 34.0 pg   MCHC 32.8 30.0 - 36.0 g/dL   RDW 13.3 11.5 - 15.5 %   Platelets 203 150 - 400 K/uL   nRBC 0.0 0.0 - 0.2 %  Basic metabolic panel     Status: Abnormal   Collection Time: 02/08/19  2:17 PM  Result Value Ref Range   Sodium 134 (L) 135 - 145 mmol/L   Potassium 4.0 3.5 - 5.1 mmol/L   Chloride 105 98 - 111 mmol/L   CO2 21 (L) 22 - 32 mmol/L   Glucose, Bld 92 70 - 99 mg/dL   BUN 5 (L) 6 - 20 mg/dL   Creatinine, Ser 0.74 0.44 - 1.00 mg/dL   Calcium 9.2 8.9 - 10.3 mg/dL   GFR calc non Af Amer >60 >60 mL/min   GFR calc Af Amer >60 >60 mL/min   Anion gap 8 5 - 15   MAU Course  Procedures Prolonged EFM Tylenol  MDM Labs and EKG ordered. Head imaging not indicated  based on [place] [facility] head protocol.  1543: Pt "feeling much better", pain improved. No new complaints. No evidence of PTL or abruption. Waiting for meal from cafeteria then will discharge home.   Assessment and Plan   1. [redacted] weeks gestation of pregnancy   2. Vasovagal syncope   3. Fall, initial encounter   4. NST (non-stress test) reactive    Discharge home Follow up at [facility] as scheduled Abruption precautions Head trauma precautions FMCs  Allergies as of 02/08/2019   No Known Allergies     Medication List    STOP taking these medications   senna 176 MG/5ML Syrp Commonly known as: SENOKOT     TAKE these medications   aspirin 81 MG chewable tablet Chew 81 mg by mouth daily.   cyclobenzaprine 10 MG tablet Commonly known as: FLEXERIL Take 1 tablet (10 mg total) by mouth 2 (two) times daily as needed for muscle spasms.   doxylamine (Sleep) 25 MG tablet Commonly known as: UNISOM Take 25 mg by mouth at bedtime as needed.   famotidine 20 MG tablet Commonly known as: PEPCID Take 20 mg by mouth 2 (two) times daily.   prenatal vitamin w/FE, FA 27-1 MG Tabs tablet Take 1 tablet by mouth daily at 12 noon.   pyridOXINE 100 MG tablet Commonly known as: VITAMIN B-6 Take 100 mg by mouth daily.   terconazole 0.4 % vaginal cream Commonly known as: Terazol 7 Place 1 applicator vaginally at bedtime.      Donette Larry, CNM 02/08/2019, 4:59 PM

## 2019-02-08 NOTE — Discharge Instructions (Signed)
Preventing Injuries During Pregnancy  Injuries can happen during pregnancy. Minor falls and accidents usually do not harm you or your baby. But some injuries can harm you and your baby. Tell your doctor about any injury you suffer. What can I do to avoid injuries? Safety  Remove rugs and loose objects on the floor.  Wear comfortable shoes that have a good grip. Do not wear shoes that have high heels.  Always wear your seat belt in the car. The lap belt should be below your belly. Always drive safely.  Do not ride on a motorcycle. Activity  Do not take part in rough and violent activities or sports.  Avoid: ? Walking on wet or slippery floors. ? Lifting heavy pots of boiling or hot liquids. ? Fixing electrical problems. ? Being near fires. General instructions  Take over-the-counter and prescription medicines only as told by your doctor.  Know your blood type and the blood type of the baby's father.  If you are a victim of domestic violence: ? Call your local emergency services (911 in the [place] ? Contact the [employer] Violence Hotline for help and support. Get help right away if:  You fall on your belly or receive any serious blow to your belly.  You have a stiff neck or neck pain after a fall or an injury.  You get a headache or have problems with vision after an injury.  You do not feel the baby move or the baby is not moving as much as normal.  You have been a victim of domestic violence or any other kind of attack.  You have been in a car accident.  You have bleeding from your vagina.  Fluid is leaking from your vagina.  You start to have cramping or pain in your belly (contractions).  You have very bad pain in your lower back.  You feel weak or pass out (faint).  You start to throw up (vomit) after an injury.  You have been burned. Summary  Some injuries that happen during pregnancy can do harm to the baby.  Tell your doctor about any  injury.  Take steps to avoid injury. This includes removing rugs and loose objects on the floor. Always wear your seat belt in the car.  Do not take part in rough and violent activities or sports.  Get help right away if you have any serious accident or injury. This information is not intended to replace advice given to you by your health care provider. Make sure you discuss any questions you have with your health care provider. Document Revised: 09/26/2018 Document Reviewed: 01/11/2016 Elsevier Patient Education  2020 [employer].   Pregnancy After Age 23 Women who become pregnant after the age of 23 have a higher risk for certain problems during pregnancy. This is because older women may already have health problems before becoming pregnant. Older women who are healthy before pregnancy may still develop problems during pregnancy. These problems may affect the mother, the unborn baby (fetus), or both. What are the risks for me? If you are over age 87 and you want to become pregnant or are pregnant, you may have a higher risk of:  Not being able to get pregnant (infertility).  Going into labor early (preterm labor).  Needing surgical delivery of your baby (cesarean delivery, or C-section).  Having high blood pressure (hypertension).  Having complications during pregnancy, such as high blood pressure and other symptoms (preeclampsia).  Having diabetes during pregnancy (gestational diabetes).  Being pregnant with more than one baby.  Loss of the unborn baby before 20 weeks (miscarriage) or after 20 weeks of pregnancy (stillbirth). What are the risks for my baby? Babies born to women over the age of 19 have a higher risk for:  Being born early (prematurity).  Low birth weight, which is less than 5 lb, 8 oz (2.5 kg).  Birth defects, such as Down syndrome and cleft palate.  Health complications, including problems with growth and development. How is prenatal care different for  women over age 38? All women should see their health care provider before they try to become pregnant. This is especially important for women over the age of 69. Tell your health care provider about:  Any health problems you have.  Any medicines you take.  Any family history of health problems or chromosome-related defects.  Any problems you have had with past pregnancies or deliveries. If you are over age 73 and you plan to become pregnant:  Start taking a daily multivitamin a month or more before you try to get pregnant. Your multivitamin should contain 400 mcg (micrograms) of folic acid. If you are over age 69 and pregnant, make sure you:  Keep taking your multivitamin unless your health care provider tells you not to take it.  Keep all prenatal visits as told by your health care provider. This is important.  Have ultrasounds regularly throughout your pregnancy to check for problems.  Talk with your health care provider about other prenatal screening tests that you may need. What additional prenatal tests are needed? Screening tests show whether your baby has a higher risk for birth defects than other babies. Screening tests include:  Ultrasound tests to look for markers that indicate a risk for birth defects.  Maternal blood screening. These are blood tests that measure certain substances in your blood to determine your baby's risk for defects. Screening tests do not show whether your baby has or does not have defects. They only show your baby's risk for certain defects. If your screening tests show that risk factors are present, you may need tests to confirm the defect (diagnostic testing). These tests may include:  Chorionic villus sampling. For this procedure, a tissue sample is taken from the organ that forms in your uterus to nourish your baby (placenta). The sample is removed through your cervix or abdomen and tested.  Amniocentesis. For this procedure, a small amount of the  fluid that surrounds the baby in the uterus (amniotic fluid) is removed and tested. What can I do to stay healthy during my pregnancy? Staying healthy during pregnancy can help you and your baby to have a lower risk for problems during pregnancy, during delivery, or both. Talk with your health care provider for specific instructions about staying healthy during your pregnancy. Nutrition   At each meal, eat a variety of foods from each of the five food groups. These groups include: ? Proteins such as lean meats, poultry, fish that is low in fat, beans, eggs, and nuts. ? Vegetables such as leafy greens, raw and cooked vegetables, and vegetable juice. ? Fruits that are fresh, frozen, or canned, or 100% fruit juice. ? Dairy products such as low-fat yogurt, cheese, and milk. ? Whole grains including rice, cereal, pasta, and bread.  Talk with your health care provider about how much food in each group is right for you.  Follow instructions from your health care provider about eating and drinking restrictions during pregnancy. ? Do not eat  raw eggs, raw meat, or raw fish or seafood. ? Do not eat any fish that contains high amounts of mercury, such as swordfish or mackerel.  Drink 6-8 or more glasses of water a day. You should drink enough fluid to keep your urine pale yellow. Managing weight gain  Ask your health care provider how much weight gain is healthy during pregnancy.  Stay at a healthy weight. If needed, work with your health care provider to lose weight safely. Activity  Exercise regularly, as directed by your health care provider. Ask your health care provider what forms of exercise are safe for you. General instructions  Do not use any products that contain nicotine or tobacco, such as cigarettes and e-cigarettes. If you need help quitting, ask your health care provider.  Do not drink alcohol, use drugs, or abuse prescription medicine.  Take over-the-counter and prescription  medicines only as told by your health care provider.  Do not use hot tubs, steam rooms, or saunas.  Talk with your health care provider about your risk of exposure to harmful environmental conditions. This includes exposure to chemicals, radiation, cleaning products, and cat feces. Follow advice from your health care provider about how to limit your exposure. Summary  Women who become pregnant after the age of 19 have a higher risk for complications during pregnancy.  Problems may affect the mother, the unborn baby (fetus), or both.  All women should see their health care provider before they try to become pregnant. This is especially important for women over the age of 49.  Staying healthy during pregnancy can help both you and your baby to have a lower risk for some of the problems that can happen during pregnancy, during delivery, or both. This information is not intended to replace advice given to you by your health care provider. Make sure you discuss any questions you have with your health care provider. Document Revised: 04/25/2018 Document Reviewed: 04/23/2016 Elsevier Patient Education  [place].

## 2019-02-09 LAB — GC/CHLAMYDIA PROBE AMP (~~LOC~~) NOT AT ARMC
Chlamydia: NEGATIVE
Comment: NEGATIVE
Comment: NORMAL
Neisseria Gonorrhea: NEGATIVE

## 2019-03-04 ENCOUNTER — Other Ambulatory Visit: Payer: Self-pay

## 2019-03-04 ENCOUNTER — Encounter (HOSPITAL_COMMUNITY): Payer: Self-pay | Admitting: Obstetrics and Gynecology

## 2019-03-04 ENCOUNTER — Inpatient Hospital Stay (HOSPITAL_COMMUNITY)
Admission: AD | Admit: 2019-03-04 | Discharge: 2019-03-04 | Disposition: A | Discharge status: Home / Self Care | Payer: Medicaid Other | Attending: Obstetrics and Gynecology | Admitting: Obstetrics and Gynecology

## 2019-03-04 DIAGNOSIS — O4703 False labor before 37 completed weeks of gestation, third trimester: Secondary | ICD-10-CM | POA: Insufficient documentation

## 2019-03-04 DIAGNOSIS — M549 Dorsalgia, unspecified: Secondary | ICD-10-CM | POA: Insufficient documentation

## 2019-03-04 DIAGNOSIS — O4700 False labor before 37 completed weeks of gestation, unspecified trimester: Secondary | ICD-10-CM

## 2019-03-04 DIAGNOSIS — Z7982 Long term (current) use of aspirin: Secondary | ICD-10-CM | POA: Insufficient documentation

## 2019-03-04 DIAGNOSIS — Z3A31 31 weeks gestation of pregnancy: Secondary | ICD-10-CM | POA: Diagnosis not present

## 2019-03-04 DIAGNOSIS — O99891 Other specified diseases and conditions complicating pregnancy: Secondary | ICD-10-CM | POA: Diagnosis not present

## 2019-03-04 DIAGNOSIS — O479 False labor, unspecified: Secondary | ICD-10-CM

## 2019-03-04 DIAGNOSIS — R109 Unspecified abdominal pain: Secondary | ICD-10-CM | POA: Diagnosis present

## 2019-03-04 LAB — URINALYSIS, ROUTINE W REFLEX MICROSCOPIC
Bilirubin Urine: NEGATIVE
Glucose, UA: NEGATIVE mg/dL
Ketones, ur: NEGATIVE mg/dL
Nitrite: NEGATIVE
Protein, ur: NEGATIVE mg/dL
Specific Gravity, Urine: 1.017 (ref 1.005–1.030)
pH: 6 (ref 5.0–8.0)

## 2019-03-04 LAB — FETAL FIBRONECTIN: Fetal Fibronectin: NEGATIVE

## 2019-03-04 LAB — WET PREP, GENITAL
Clue Cells Wet Prep HPF POC: NONE SEEN
Sperm: NONE SEEN
Trich, Wet Prep: NONE SEEN
Yeast Wet Prep HPF POC: NONE SEEN

## 2019-03-04 MED ORDER — CYCLOBENZAPRINE HCL 5 MG PO TABS: 5.0000 mg | ORAL_TABLET | Freq: Three times a day (TID) | ORAL | 0 refills | Status: DC | PRN

## 2019-03-04 MED ORDER — BETAMETHASONE SOD PHOS & ACET 6 (3-3) MG/ML IJ SUSP
12.0000 mg | Freq: Once | INTRAMUSCULAR | Status: AC
  Administered 2019-03-04: 19:00:00 12 mg via INTRAMUSCULAR
  Filled 2019-03-04: qty 5

## 2019-03-04 MED ORDER — CYCLOBENZAPRINE HCL 5 MG PO TABS
10.0000 mg | ORAL_TABLET | Freq: Once | ORAL | Status: AC
  Administered 2019-03-04: 20:00:00 10 mg via ORAL
  Filled 2019-03-04: qty 2

## 2019-03-04 NOTE — Discharge Instructions (Signed)

## 2019-03-04 NOTE — MAU Note (Signed)
Pt reporting contractions since 1400. Denies LOF or VB. +FM. Has had some back, but contractions are new. Is not timing them but says they are consistent. Rating pain 9/10.

## 2019-03-04 NOTE — MAU Provider Note (Addendum)
History     CSN: [id number]  Arrival date and time: 03/04/19 8756   First Provider Initiated Contact with Patient 03/04/19 1807      Chief Complaint  Patient presents with  . Contractions   Ms.  Ashley Santana is a 29 y.o. year old G1P0 female at [redacted]w[redacted]d weeks gestation who presents to MAU reporting consistent contractions since about 1430 today. She reports having back pain already, but now the contractions are making it worse. She rates the pain a 9/10. She works from home and was finishing her lunch break when the contractions stopped.She denies VB or LOF. She receives [facility] at [facility]. Her last appointment was on 02/24/2019 and her next appointment is on 03/10/2019.    Past Medical History:  Diagnosis Date  . Headache    Migraines    Past Surgical History:  Procedure Laterality Date  . NO PAST SURGERIES      Family History  Problem Relation Age of Onset  . Diabetes Maternal Uncle   . Diabetes Paternal Uncle     Social History   Tobacco Use  . Smoking status: Never Smoker  . Smokeless tobacco: Never Used  Substance Use Topics  . Alcohol use: No  . Drug use: No    Allergies: No Known Allergies  Medications Prior to Admission  Medication Sig Dispense Refill Last Dose  . aspirin 81 MG chewable tablet Chew 81 mg by mouth daily.   03/04/2019 at Unknown time  . cyclobenzaprine (FLEXERIL) 10 MG tablet Take 1 tablet (10 mg total) by mouth 2 (two) times daily as needed for muscle spasms. 90 tablet 0 03/03/2019 at Unknown time  . prenatal vitamin w/FE, FA (PRENATAL 1 + 1) 27-1 MG TABS tablet Take 1 tablet by mouth daily at 12 noon.   03/04/2019 at Unknown time  . pyridOXINE (VITAMIN B-6) 100 MG tablet Take 100 mg by mouth daily.   03/04/2019 at Unknown time  . doxylamine, Sleep, (UNISOM) 25 MG tablet Take 25 mg by mouth at bedtime as needed.   Unknown at Unknown time  . famotidine (PEPCID) 20 MG tablet Take 20 mg by mouth 2 (two) times daily.   Unknown at Unknown time  . terconazole  (TERAZOL 7) 0.4 % vaginal cream Place 1 applicator vaginally at bedtime. 45 g 0 Unknown at Unknown time    Review of Systems  Constitutional: Negative.   HENT: Negative.   Eyes: Negative.   Respiratory: Negative.   Cardiovascular: Negative.   Gastrointestinal: Negative.   Endocrine: Negative.   Genitourinary: Positive for pelvic pain (consistent contractions).  Musculoskeletal: Positive for back pain.  Skin: Negative.   Allergic/Immunologic: Negative.   Neurological: Negative.   Hematological: Negative.   Psychiatric/Behavioral: Negative.    Physical Exam   Blood pressure 118/73, pulse 91, temperature 98.6 F (37 C), temperature source Oral, resp. rate 16, height 5\' 2"  (1.575 m), weight 104.8 kg, SpO2 98 %.  Physical Exam  Nursing note and vitals reviewed. Constitutional: She is oriented to person, place, and time. She appears well-developed and well-nourished.  HENT:  Head: Normocephalic and atraumatic.  Eyes: Pupils are equal, round, and reactive to light.  Cardiovascular: Normal rate.  Respiratory: Effort normal.  GI: Soft.  Genitourinary:    Genitourinary Comments: Uterus: gravid, S>D, SE: cervix is smooth, pink, no lesions, small amt of thick, yellowish-white vaginal d/c -- WP, GC/CT done, no CMT or friability, no adnexal tenderness  Dilation: 1 cm (Ext Os); Fingertip (Int Os) Exam by:: Ashley Santana  CNM     Musculoskeletal:        General: Normal range of motion.     Cervical back: Normal range of motion.  Neurological: She is alert and oriented to person, place, and time.  Skin: Skin is warm and dry.  Psychiatric: She has a normal mood and affect. Her behavior is normal. Judgment and thought content normal.    MAU Course  Procedures  MDM Wet Prep GC/CT fFN BMZ 12 mg IM injection Reassessment @ 1955: patient still complains of pain in back; Flexeril offered Flexeril 10 mg   Results for orders placed or performed during the hospital encounter of 03/04/19 (from  the past 24 hour(s))  Urinalysis, Routine w reflex microscopic     Status: Abnormal   Collection Time: 03/04/19  5:10 PM  Result Value Ref Range   Color, Urine YELLOW YELLOW   APPearance HAZY (A) CLEAR   Specific Gravity, Urine 1.017 1.005 - 1.030   pH 6.0 5.0 - 8.0   Glucose, UA NEGATIVE NEGATIVE mg/dL   Hgb urine dipstick SMALL (A) NEGATIVE   Bilirubin Urine NEGATIVE NEGATIVE   Ketones, ur NEGATIVE NEGATIVE mg/dL   Protein, ur NEGATIVE NEGATIVE mg/dL   Nitrite NEGATIVE NEGATIVE   Leukocytes,Ua TRACE (A) NEGATIVE   RBC / HPF 11-20 0 - 5 RBC/hpf   WBC, UA 0-5 0 - 5 WBC/hpf   Bacteria, UA MANY (A) NONE SEEN   Squamous Epithelial / LPF 0-5 0 - 5   Mucus PRESENT   Wet prep, genital     Status: Abnormal   Collection Time: 03/04/19  6:15 PM   Specimen: Cervix  Result Value Ref Range   Yeast Wet Prep HPF POC NONE SEEN NONE SEEN   Trich, Wet Prep NONE SEEN NONE SEEN   Clue Cells Wet Prep HPF POC NONE SEEN NONE SEEN   WBC, Wet Prep HPF POC MANY (A) NONE SEEN   Sperm NONE SEEN   Fetal fibronectin     Status: None   Collection Time: 03/04/19  6:23 PM  Result Value Ref Range   Fetal Fibronectin NEGATIVE NEGATIVE    Report given to and care assumed by Hansel Feinstein, CNM @ [address] MSN, CNM 03/04/2019, 6:53 PM Assessment and Plan   Assumed care Feel better after Flexeril Contractions stopped  Single IUP at [redacted]w[redacted]d Preterm uterine contractions Back pain   Discharge home Rx Flexeril for home use prn spasm PTL precautions Return tomorrow for Betamethasone Out of work until Monday  [place], [place]

## 2019-03-05 ENCOUNTER — Inpatient Hospital Stay (HOSPITAL_COMMUNITY)
Admission: AD | Admit: 2019-03-05 | Discharge: 2019-03-05 | Disposition: A | Discharge status: Home / Self Care | Payer: Medicaid Other | Attending: Obstetrics and Gynecology | Admitting: Obstetrics and Gynecology

## 2019-03-05 ENCOUNTER — Other Ambulatory Visit: Payer: Self-pay

## 2019-03-05 DIAGNOSIS — Z7982 Long term (current) use of aspirin: Secondary | ICD-10-CM | POA: Diagnosis not present

## 2019-03-05 DIAGNOSIS — Z833 Family history of diabetes mellitus: Secondary | ICD-10-CM | POA: Diagnosis not present

## 2019-03-05 DIAGNOSIS — O4703 False labor before 37 completed weeks of gestation, third trimester: Secondary | ICD-10-CM | POA: Diagnosis present

## 2019-03-05 DIAGNOSIS — Z3A31 31 weeks gestation of pregnancy: Secondary | ICD-10-CM | POA: Diagnosis not present

## 2019-03-05 LAB — GC/CHLAMYDIA PROBE AMP (~~LOC~~) NOT AT ARMC
Chlamydia: NEGATIVE
Comment: NEGATIVE
Comment: NORMAL
Neisseria Gonorrhea: NEGATIVE

## 2019-03-05 LAB — CULTURE, OB URINE

## 2019-03-05 MED ORDER — BETAMETHASONE SOD PHOS & ACET 6 (3-3) MG/ML IJ SUSP
12.0000 mg | Freq: Once | INTRAMUSCULAR | Status: AC
  Administered 2019-03-05: 19:00:00 12 mg via INTRAMUSCULAR

## 2019-03-05 NOTE — Discharge Instructions (Signed)

## 2019-03-05 NOTE — MAU Provider Note (Signed)
Chief Complaint:  Injections   First Provider Initiated Contact with Patient 03/05/19 1957      HPI: Ashley Santana is a 29 y.o. G1P0 at 44w2dwho presents to maternity admissions reporting being here for second dose of betamethasone..  She reports good fetal movement, denies LOF, vaginal bleeding, urinary symptoms, h/a, dizziness, n/v, diarrhea, constipation or fever/chills.    Past Medical History: Past Medical History:  Diagnosis Date  . Headache    Migraines    Past obstetric history: OB History  Gravida Para Term Preterm AB Living  1            SAB TAB Ectopic Multiple Live Births               # Outcome Date GA Lbr Len/2nd Weight Sex Delivery Anes PTL Lv  1 Current             Past Surgical History: Past Surgical History:  Procedure Laterality Date  . NO PAST SURGERIES      Family History: Family History  Problem Relation Age of Onset  . Diabetes Maternal Uncle   . Diabetes Paternal Uncle     Social History: Social History   Tobacco Use  . Smoking status: Never Smoker  . Smokeless tobacco: Never Used  Substance Use Topics  . Alcohol use: No  . Drug use: No    Allergies: No Known Allergies  Meds:  Medications Prior to Admission  Medication Sig Dispense Refill Last Dose  . aspirin 81 MG chewable tablet Chew 81 mg by mouth daily.     . cyclobenzaprine (FLEXERIL) 5 MG tablet Take 1 tablet (5 mg total) by mouth every 8 (eight) hours as needed for muscle spasms. 30 tablet 0   . doxylamine, Sleep, (UNISOM) 25 MG tablet Take 25 mg by mouth at bedtime as needed.     . famotidine (PEPCID) 20 MG tablet Take 20 mg by mouth 2 (two) times daily.     . prenatal vitamin w/FE, FA (PRENATAL 1 + 1) 27-1 MG TABS tablet Take 1 tablet by mouth daily at 12 noon.     . pyridOXINE (VITAMIN B-6) 100 MG tablet Take 100 mg by mouth daily.     Marland Kitchen terconazole (TERAZOL 7) 0.4 % vaginal cream Place 1 applicator vaginally at bedtime. 45 g 0     I have reviewed patient's Past  Medical Hx, Surgical Hx, Family Hx, Social Hx, medications and allergies.   ROS:  Review of Systems  Constitutional: Negative for chills and fever.  Respiratory: Negative for shortness of breath.   Genitourinary: Negative for pelvic pain.  Musculoskeletal: Negative for back pain.   Other systems negative  Physical Exam   Patient Vitals for the past 24 hrs:  BP Temp Temp src Pulse Resp  03/05/19 1912 134/60 98.6 F (37 C) Oral (!) 102 17   Constitutional: Well-developed, well-nourished female in no acute distress.  Cardiovascular: normal rate and rhythm Respiratory: normal effort, clear to auscultation bilaterally GI: Abd soft, non-tender, gravid appropriate for gestational age.   MS: Extremities nontender, no edema, normal ROM Neurologic: Alert and oriented x 4.  GU: Neg CVAT.  PELVIC EXAM:  deferred   Labs: No results found for this or any previous visit (from the past 24 hour(s)). --/--/O POS (09/25 1143)  Imaging:  No results found.  MAU Course/MDM: Treatments in MAU included Betamethasone injection #2.    Assessment: Preterm uterine contractions, resolved Back pain, resolved  Plan: Discharge home Preterm  Labor  precautions and fetal kick counts Follow up in Office for prenatal visits   Encouraged to return here or to other Urgent Care/ED if she develops worsening of symptoms, increase in pain, fever, or other concerning symptoms.  Pt stable at time of discharge.  Hansel Feinstein CNM, MSN Certified Nurse-Midwife 03/05/2019 7:57 PM

## 2019-03-05 NOTE — MAU Note (Signed)
Patient reports to MAU for f/u BMZ injection. Pt denies any problems at this time. No pain, bleeding, LOF, or vaginal discharge. +FM.

## 2019-03-11 ENCOUNTER — Inpatient Hospital Stay (HOSPITAL_COMMUNITY)
Admission: AD | Admit: 2019-03-11 | Discharge: 2019-03-11 | Disposition: A | Discharge status: Home / Self Care | Payer: Medicaid Other | Attending: Obstetrics and Gynecology | Admitting: Obstetrics and Gynecology

## 2019-03-11 ENCOUNTER — Other Ambulatory Visit: Payer: Self-pay

## 2019-03-11 ENCOUNTER — Encounter (HOSPITAL_COMMUNITY): Payer: Self-pay | Admitting: Obstetrics and Gynecology

## 2019-03-11 DIAGNOSIS — N898 Other specified noninflammatory disorders of vagina: Secondary | ICD-10-CM | POA: Diagnosis not present

## 2019-03-11 DIAGNOSIS — Z3A32 32 weeks gestation of pregnancy: Secondary | ICD-10-CM | POA: Insufficient documentation

## 2019-03-11 DIAGNOSIS — Z7982 Long term (current) use of aspirin: Secondary | ICD-10-CM | POA: Diagnosis not present

## 2019-03-11 DIAGNOSIS — Z0371 Encounter for suspected problem with amniotic cavity and membrane ruled out: Secondary | ICD-10-CM

## 2019-03-11 DIAGNOSIS — O26893 Other specified pregnancy related conditions, third trimester: Secondary | ICD-10-CM

## 2019-03-11 DIAGNOSIS — Z79899 Other long term (current) drug therapy: Secondary | ICD-10-CM | POA: Insufficient documentation

## 2019-03-11 LAB — URINALYSIS, ROUTINE W REFLEX MICROSCOPIC
Bilirubin Urine: NEGATIVE
Glucose, UA: NEGATIVE mg/dL
Hgb urine dipstick: NEGATIVE
Ketones, ur: 5 mg/dL — AB
Nitrite: NEGATIVE
Protein, ur: NEGATIVE mg/dL
Specific Gravity, Urine: 1.021 (ref 1.005–1.030)
pH: 6 (ref 5.0–8.0)

## 2019-03-11 LAB — WET PREP, GENITAL
Clue Cells Wet Prep HPF POC: NONE SEEN
Sperm: NONE SEEN
Trich, Wet Prep: NONE SEEN
Yeast Wet Prep HPF POC: NONE SEEN

## 2019-03-11 LAB — AMNISURE RUPTURE OF MEMBRANE (ROM) NOT AT ARMC: Amnisure ROM: NEGATIVE

## 2019-03-11 NOTE — MAU Note (Signed)
.   Ashley Santana is a 29 y.o. at [redacted]w[redacted]d here in MAU reporting: she noticed  leakage of clear fluid this morning around 0815 . Denies any VB  Denies pain  Onset of complaint: 0815 this morning Pain score: 0 Vitals:   03/11/19 1822  BP: 132/68  Pulse: 96  Resp: 18  Temp: 98 F (36.7 C)     FHT:145 Lab orders placed from triage: UA

## 2019-03-11 NOTE — Discharge Instructions (Signed)

## 2019-03-11 NOTE — MAU Provider Note (Signed)
History     CSN: [id number]  Arrival date and time: 03/11/19 1756   First Provider Initiated Contact with Patient 03/11/19 1858      Chief Complaint  Patient presents with  . Vaginal Discharge  . Rupture of Membranes   HPI   Ms.Ashley Santana is a 29 y.o. female G1P0 @ [redacted]w[redacted]d here with leaking of clear fluid that she first noticed today at 26. She did not notice a gush of fluid, however noticed on her pad each time she went to the bathroom. The fluid soaked the pad "just a few times", however she never felt a gush. + fetal movement. No bleeding.  Received BMZ on 2/17 & 2/18 for threatened preterm labor. She has no pain today.   OB History    Gravida  1   Para      Term      Preterm      AB      Living        SAB      TAB      Ectopic      Multiple      Live Births              Past Medical History:  Diagnosis Date  . Headache    Migraines    Past Surgical History:  Procedure Laterality Date  . NO PAST SURGERIES      Family History  Problem Relation Age of Onset  . Diabetes Maternal Uncle   . Diabetes Paternal Uncle     Social History   Tobacco Use  . Smoking status: Never Smoker  . Smokeless tobacco: Never Used  Substance Use Topics  . Alcohol use: No  . Drug use: No    Allergies: No Known Allergies  Medications Prior to Admission  Medication Sig Dispense Refill Last Dose  . aspirin 81 MG chewable tablet Chew 81 mg by mouth daily.   03/11/2019 at Unknown time  . doxylamine, Sleep, (UNISOM) 25 MG tablet Take 25 mg by mouth at bedtime as needed.   03/11/2019 at Unknown time  . famotidine (PEPCID) 20 MG tablet Take 20 mg by mouth 2 (two) times daily.   03/11/2019 at Unknown time  . prenatal vitamin w/FE, FA (PRENATAL 1 + 1) 27-1 MG TABS tablet Take 1 tablet by mouth daily at 12 noon.   03/11/2019 at Unknown time  . pyridOXINE (VITAMIN B-6) 100 MG tablet Take 100 mg by mouth daily.   03/11/2019 at Unknown time  . cyclobenzaprine (FLEXERIL) 5  MG tablet Take 1 tablet (5 mg total) by mouth every 8 (eight) hours as needed for muscle spasms. 30 tablet 0   . terconazole (TERAZOL 7) 0.4 % vaginal cream Place 1 applicator vaginally at bedtime. 45 g 0    Results for orders placed or performed during the hospital encounter of 03/11/19 (from the past 48 hour(s))  Urinalysis, Routine w reflex microscopic     Status: Abnormal   Collection Time: 03/11/19  6:12 PM  Result Value Ref Range   Color, Urine YELLOW YELLOW   APPearance HAZY (A) CLEAR   Specific Gravity, Urine 1.021 1.005 - 1.030   pH 6.0 5.0 - 8.0   Glucose, UA NEGATIVE NEGATIVE mg/dL   Hgb urine dipstick NEGATIVE NEGATIVE   Bilirubin Urine NEGATIVE NEGATIVE   Ketones, ur 5 (A) NEGATIVE mg/dL   Protein, ur NEGATIVE NEGATIVE mg/dL   Nitrite NEGATIVE NEGATIVE   Leukocytes,Ua SMALL (A) NEGATIVE   RBC /  HPF 0-5 0 - 5 RBC/hpf   WBC, UA 0-5 0 - 5 WBC/hpf   Bacteria, UA MANY (A) NONE SEEN   Squamous Epithelial / LPF 0-5 0 - 5   Mucus PRESENT     Comment: Performed at [facility], [place] [address], [place], [place] [address]  Wet prep, genital     Status: Abnormal   Collection Time: 03/11/19  7:24 PM  Result Value Ref Range   Yeast Wet Prep HPF POC NONE SEEN NONE SEEN   Trich, Wet Prep NONE SEEN NONE SEEN   Clue Cells Wet Prep HPF POC NONE SEEN NONE SEEN   WBC, Wet Prep HPF POC MANY (A) NONE SEEN   Sperm NONE SEEN     Comment: Performed at [facility], [place] [address], [place], [place] [address]  Amnisure rupture of membrane (rom)not at [facility]     Status: None   Collection Time: 03/11/19  7:24 PM  Result Value Ref Range   Amnisure ROM NEGATIVE     Comment: Performed at [facility], [address], [place], [place] [address]   Review of Systems  Gastrointestinal: Negative for abdominal pain.  Genitourinary: Positive for vaginal discharge. Negative for dysuria, frequency, pelvic pain, urgency and vaginal bleeding.   Physical Exam   Blood pressure 132/68,  pulse 96, temperature 98 F (36.7 C), resp. rate 18, weight 103.4 kg.  Physical Exam  Constitutional: She is oriented to person, place, and time. She appears well-developed and well-nourished. No distress.  HENT:  Head: Normocephalic.  Eyes: Pupils are equal, round, and reactive to light.  GI: Soft. She exhibits no distension. There is no abdominal tenderness. There is no rebound and no guarding.  Genitourinary:    Genitourinary Comments: Vagina - Scant amount of thin white vaginal discharge, no odor, no pooling of discharge/fluid  Cervix - No contact bleeding, no active bleeding  Bimanual exam: deferred  Wet prep done Chaperone present for exam.    Musculoskeletal:        General: Normal range of motion.  Neurological: She is alert and oriented to person, place, and time.  Skin: Skin is warm. She is not diaphoretic.  Psychiatric: Her behavior is normal.    Fetal Tracing: Baseline: 140 bpm Variability: Moderate  Accelerations: 15x15 Decelerations: None Toco: UI   MAU Course  Procedures  None  MDM  + leukocytes on UA: she is without urinary complaints. Urine culture pending Wet prep collect: No yeast or BV Fern slide negative: reviewed by myself Amnisure negative.  No pooling of fluid on exam.   Assessment and Plan   A:  1. [redacted] weeks gestation of pregnancy   2. Encounter for suspected premature rupture of amniotic membranes, with rupture of membranes not found   3. Vaginal discharge in pregnancy in third trimester     P:  Discharge home in stable condition Message left with the after hours VM @ [facility]. She has follow up on 3/10, however if sooner f/u is warranted the office will call Discussed normalcy of discharge in pregnancy Return to MAU if symptoms worsen  0/10 pain at the time of discharge.    Lezlie Lye, NP 03/11/2019 8:04 PM

## 2019-03-12 LAB — CULTURE, OB URINE

## 2019-04-13 ENCOUNTER — Encounter (HOSPITAL_COMMUNITY): Payer: Self-pay | Admitting: Obstetrics & Gynecology

## 2019-04-13 ENCOUNTER — Inpatient Hospital Stay (HOSPITAL_COMMUNITY)
Admission: AD | Admit: 2019-04-13 | Discharge: 2019-04-13 | Disposition: A | Discharge status: Home / Self Care | Payer: Medicaid Other | Attending: Obstetrics & Gynecology | Admitting: Obstetrics & Gynecology

## 2019-04-13 DIAGNOSIS — Z3A36 36 weeks gestation of pregnancy: Secondary | ICD-10-CM | POA: Diagnosis not present

## 2019-04-13 DIAGNOSIS — O4703 False labor before 37 completed weeks of gestation, third trimester: Secondary | ICD-10-CM

## 2019-04-13 DIAGNOSIS — O26893 Other specified pregnancy related conditions, third trimester: Secondary | ICD-10-CM | POA: Insufficient documentation

## 2019-04-13 DIAGNOSIS — Z3689 Encounter for other specified antenatal screening: Secondary | ICD-10-CM | POA: Insufficient documentation

## 2019-04-13 DIAGNOSIS — R109 Unspecified abdominal pain: Secondary | ICD-10-CM | POA: Diagnosis present

## 2019-04-13 DIAGNOSIS — R030 Elevated blood-pressure reading, without diagnosis of hypertension: Secondary | ICD-10-CM | POA: Diagnosis not present

## 2019-04-13 LAB — URINALYSIS, ROUTINE W REFLEX MICROSCOPIC
Bilirubin Urine: NEGATIVE
Glucose, UA: NEGATIVE mg/dL
Hgb urine dipstick: NEGATIVE
Ketones, ur: NEGATIVE mg/dL
Nitrite: NEGATIVE
Protein, ur: NEGATIVE mg/dL
Specific Gravity, Urine: 1.014 (ref 1.005–1.030)
pH: 6 (ref 5.0–8.0)

## 2019-04-13 NOTE — MAU Note (Signed)
.   Ashley Santana is a 29 y.o. at [redacted]w[redacted]d here in MAU reporting: with complaints of contractions that started around 3 this morning. Denies any LOF of VB  Onset of complaint: 3 am Pain score: 6 Vitals:   04/13/19 0928  BP: 140/65  Pulse: (!) 102  Resp: 16  Temp: 98.6 F (37 C)  SpO2: 99%     FHT:145 Lab orders placed from triage: UA

## 2019-04-13 NOTE — Discharge Instructions (Signed)

## 2019-04-13 NOTE — MAU Provider Note (Signed)
  S: Ms. Ashley Santana is a 29 y.o. G1P0 at [redacted]w[redacted]d  who presents to MAU today complaining of irregular contractions since 0300 today. She denies vaginal bleeding. She denies LOF. She reports normal fetal movement.    O: BP 132/73 (BP Location: Right Arm)   Pulse 93   Temp 98.6 F (37 C)   Resp 18   Ht 5\' 2"  (1.575 m)   Wt 104.3 kg   SpO2 98%   BMI 42.07 kg/m  GENERAL: Well-developed, well-nourished female in no acute distress.  HEAD: Normocephalic, atraumatic.  CHEST: Normal effort of breathing, regular heart rate ABDOMEN: Soft, nontender, gravid  Cervical exam:  Dilation: 1 Effacement (%): 50 Cervical Position: Middle Station: -2 Presentation: Vertex Exam by:: [email or web address], RN  Fetal Monitoring: Baseline: 145 Variability: Mod Accelerations: POS 15 x 15 Decelerations: N/A Contractions: Irregular, up to 8 minutes between contractions  A: SIUP at [redacted]w[redacted]d  Patient made change from FT at 0930 to 1/thick/posterior at 11 She was offered an additional hour of evaluation during which time she did not make cervical change  Elevated BP x 1 (140/65) while reporting pain score of 8/10 No history of elevated BP, no severe symptoms reported by patient Normotensive on recheck   P: Discharge home in stable condition Advised blood pressure check on home cuff this evening or in clinic tomorrow   [redacted]w[redacted]d, CNM 04/13/2019 1:10 PM

## 2019-04-14 LAB — CULTURE, OB URINE: Culture: <10000 — AB

## 2019-04-24 ENCOUNTER — Telehealth (HOSPITAL_COMMUNITY): Payer: Self-pay | Admitting: *Deleted

## 2019-04-24 ENCOUNTER — Other Ambulatory Visit: Payer: Self-pay | Admitting: Obstetrics and Gynecology

## 2019-04-24 NOTE — Telephone Encounter (Signed)
Preadmission screen  

## 2019-04-27 ENCOUNTER — Other Ambulatory Visit (HOSPITAL_COMMUNITY)
Admission: RE | Admit: 2019-04-27 | Discharge: 2019-04-27 | Disposition: A | Discharge status: Home / Self Care | Payer: Medicaid Other | Source: Ambulatory Visit | Attending: Obstetrics and Gynecology | Admitting: Obstetrics and Gynecology

## 2019-04-27 ENCOUNTER — Encounter (HOSPITAL_COMMUNITY): Payer: Self-pay | Admitting: *Deleted

## 2019-04-27 DIAGNOSIS — Z01812 Encounter for preprocedural laboratory examination: Secondary | ICD-10-CM | POA: Insufficient documentation

## 2019-04-27 DIAGNOSIS — Z20822 Contact with and (suspected) exposure to covid-19: Secondary | ICD-10-CM | POA: Insufficient documentation

## 2019-04-27 LAB — SARS CORONAVIRUS 2 (TAT 6-24 HRS): SARS Coronavirus 2: NEGATIVE

## 2019-04-29 ENCOUNTER — Inpatient Hospital Stay (HOSPITAL_COMMUNITY)
Admission: AD | Admit: 2019-04-29 | Discharge: 2019-05-01 | DRG: 806 | Disposition: A | Discharge status: Home / Self Care | Payer: Medicaid Other | Attending: Obstetrics & Gynecology | Admitting: Obstetrics & Gynecology

## 2019-04-29 ENCOUNTER — Other Ambulatory Visit: Payer: Self-pay

## 2019-04-29 ENCOUNTER — Inpatient Hospital Stay (HOSPITAL_COMMUNITY): Payer: Medicaid Other | Admitting: Anesthesiology

## 2019-04-29 ENCOUNTER — Encounter (HOSPITAL_COMMUNITY): Payer: Self-pay | Admitting: Obstetrics and Gynecology

## 2019-04-29 ENCOUNTER — Inpatient Hospital Stay (HOSPITAL_COMMUNITY): Payer: Medicaid Other

## 2019-04-29 DIAGNOSIS — R030 Elevated blood-pressure reading, without diagnosis of hypertension: Secondary | ICD-10-CM | POA: Diagnosis present

## 2019-04-29 DIAGNOSIS — O9081 Anemia of the puerperium: Secondary | ICD-10-CM | POA: Diagnosis not present

## 2019-04-29 DIAGNOSIS — D62 Acute posthemorrhagic anemia: Secondary | ICD-10-CM | POA: Diagnosis not present

## 2019-04-29 DIAGNOSIS — E669 Obesity, unspecified: Secondary | ICD-10-CM | POA: Diagnosis present

## 2019-04-29 DIAGNOSIS — O99824 Streptococcus B carrier state complicating childbirth: Secondary | ICD-10-CM | POA: Diagnosis present

## 2019-04-29 DIAGNOSIS — O26893 Other specified pregnancy related conditions, third trimester: Principal | ICD-10-CM | POA: Diagnosis present

## 2019-04-29 DIAGNOSIS — O99214 Obesity complicating childbirth: Secondary | ICD-10-CM | POA: Diagnosis present

## 2019-04-29 DIAGNOSIS — Z3A39 39 weeks gestation of pregnancy: Secondary | ICD-10-CM | POA: Diagnosis not present

## 2019-04-29 LAB — CBC
HCT: 34.7 % — ABNORMAL LOW (ref 36.0–46.0)
Hemoglobin: 11.6 g/dL — ABNORMAL LOW (ref 12.0–15.0)
MCH: 28.6 pg (ref 26.0–34.0)
MCHC: 33.4 g/dL (ref 30.0–36.0)
MCV: 85.5 fL (ref 80.0–100.0)
Platelets: 219 10*3/uL (ref 150–400)
RBC: 4.06 MIL/uL (ref 3.87–5.11)
RDW: 14.7 % (ref 11.5–15.5)
WBC: 7.3 10*3/uL (ref 4.0–10.5)
nRBC: 0 % (ref 0.0–0.2)

## 2019-04-29 LAB — TYPE AND SCREEN
ABO/RH(D): O POS
Antibody Screen: NEGATIVE

## 2019-04-29 LAB — RPR: RPR Ser Ql: NONREACTIVE

## 2019-04-29 MED ORDER — TRANEXAMIC ACID-NACL 1000-0.7 MG/100ML-% IV SOLN: 1000.0000 mg | INTRAVENOUS | Status: AC

## 2019-04-29 MED ORDER — SODIUM CHLORIDE (PF) 0.9 % IJ SOLN
INTRAMUSCULAR | Status: DC | PRN
  Administered 2019-04-29: 12 mL/h via EPIDURAL

## 2019-04-29 MED ORDER — SODIUM CHLORIDE 0.9% FLUSH
3.0000 mL | Freq: Two times a day (BID) | INTRAVENOUS | Status: DC
  Administered 2019-05-01: 10:00:00 3 mL via INTRAVENOUS

## 2019-04-29 MED ORDER — WITCH HAZEL-GLYCERIN EX PADS: 1.0000 "application " | MEDICATED_PAD | CUTANEOUS | Status: DC | PRN

## 2019-04-29 MED ORDER — TERBUTALINE SULFATE 1 MG/ML IJ SOLN
INTRAMUSCULAR | Status: AC
  Administered 2019-04-29: 10:00:00 0.25 mg via SUBCUTANEOUS
  Filled 2019-04-29: qty 1

## 2019-04-29 MED ORDER — LACTATED RINGERS IV SOLN
500.0000 mL | Freq: Once | INTRAVENOUS | Status: AC
  Administered 2019-04-29: 500 mL via INTRAVENOUS

## 2019-04-29 MED ORDER — MISOPROSTOL 25 MCG QUARTER TABLET
25.0000 ug | ORAL_TABLET | ORAL | Status: DC | PRN
  Administered 2019-04-29: 01:00:00 25 ug via VAGINAL
  Filled 2019-04-29 (×2): qty 1

## 2019-04-29 MED ORDER — TERBUTALINE SULFATE 1 MG/ML IJ SOLN: 0.2500 mg | Freq: Once | INTRAMUSCULAR | Status: AC

## 2019-04-29 MED ORDER — TERBUTALINE SULFATE 1 MG/ML IJ SOLN
0.2500 mg | Freq: Once | INTRAMUSCULAR | Status: AC | PRN
  Administered 2019-04-29: 07:00:00 0.25 mg via SUBCUTANEOUS
  Filled 2019-04-29: qty 1

## 2019-04-29 MED ORDER — LACTATED RINGERS IV SOLN
500.0000 mL | INTRAVENOUS | Status: DC | PRN
  Administered 2019-04-29 (×5): 500 mL via INTRAVENOUS

## 2019-04-29 MED ORDER — LIDOCAINE HCL (PF) 1 % IJ SOLN: 30.0000 mL | INTRAMUSCULAR | Status: DC | PRN

## 2019-04-29 MED ORDER — OXYCODONE HCL 5 MG PO TABS
5.0000 mg | ORAL_TABLET | ORAL | Status: DC | PRN
  Administered 2019-04-29 – 2019-04-30 (×3): 5 mg via ORAL
  Filled 2019-04-29 (×3): qty 1

## 2019-04-29 MED ORDER — OXYCODONE HCL 5 MG PO TABS: 10.0000 mg | ORAL_TABLET | ORAL | Status: DC | PRN

## 2019-04-29 MED ORDER — PHENYLEPHRINE 40 MCG/ML (10ML) SYRINGE FOR IV PUSH (FOR BLOOD PRESSURE SUPPORT): 80.0000 ug | PREFILLED_SYRINGE | INTRAVENOUS | Status: DC | PRN

## 2019-04-29 MED ORDER — ONDANSETRON HCL 4 MG/2ML IJ SOLN
4.0000 mg | Freq: Four times a day (QID) | INTRAMUSCULAR | Status: DC | PRN
  Administered 2019-04-29: 07:00:00 4 mg via INTRAVENOUS
  Filled 2019-04-29: qty 2

## 2019-04-29 MED ORDER — OXYTOCIN 40 UNITS IN NORMAL SALINE INFUSION - SIMPLE MED
2.5000 [IU]/h | INTRAVENOUS | Status: DC
  Filled 2019-04-29: qty 1000

## 2019-04-29 MED ORDER — DIPHENHYDRAMINE HCL 25 MG PO CAPS: 25.0000 mg | ORAL_CAPSULE | Freq: Four times a day (QID) | ORAL | Status: DC | PRN

## 2019-04-29 MED ORDER — SIMETHICONE 80 MG PO CHEW: 80.0000 mg | CHEWABLE_TABLET | ORAL | Status: DC | PRN

## 2019-04-29 MED ORDER — ACETAMINOPHEN 325 MG PO TABS: 650.0000 mg | ORAL_TABLET | ORAL | Status: DC | PRN

## 2019-04-29 MED ORDER — SOD CITRATE-CITRIC ACID 500-334 MG/5ML PO SOLN
30.0000 mL | ORAL | Status: DC | PRN
  Filled 2019-04-29: qty 30

## 2019-04-29 MED ORDER — SODIUM CHLORIDE 0.9% FLUSH: 3.0000 mL | INTRAVENOUS | Status: DC | PRN

## 2019-04-29 MED ORDER — OXYTOCIN BOLUS FROM INFUSION
500.0000 mL | Freq: Once | INTRAVENOUS | Status: AC
  Administered 2019-04-29: 500 mL via INTRAVENOUS

## 2019-04-29 MED ORDER — METHYLERGONOVINE MALEATE 0.2 MG/ML IJ SOLN: 0.2000 mg | Freq: Once | INTRAMUSCULAR | Status: AC

## 2019-04-29 MED ORDER — DOCUSATE SODIUM 100 MG PO CAPS
100.0000 mg | ORAL_CAPSULE | Freq: Two times a day (BID) | ORAL | Status: DC
  Administered 2019-04-29 – 2019-05-01 (×4): 100 mg via ORAL
  Filled 2019-04-29 (×4): qty 1

## 2019-04-29 MED ORDER — LACTATED RINGERS IV SOLN: INTRAVENOUS | Status: DC

## 2019-04-29 MED ORDER — DIPHENHYDRAMINE HCL 50 MG/ML IJ SOLN: 12.5000 mg | INTRAMUSCULAR | Status: DC | PRN

## 2019-04-29 MED ORDER — EPHEDRINE 5 MG/ML INJ: 10.0000 mg | INTRAVENOUS | Status: DC | PRN

## 2019-04-29 MED ORDER — SODIUM CHLORIDE 0.9 % IV SOLN: 250.0000 mL | INTRAVENOUS | Status: DC | PRN

## 2019-04-29 MED ORDER — LIDOCAINE HCL (PF) 1 % IJ SOLN
INTRAMUSCULAR | Status: DC | PRN
  Administered 2019-04-29: 3 mL via EPIDURAL
  Administered 2019-04-29: 2 mL via EPIDURAL
  Administered 2019-04-29: 5 mL via EPIDURAL

## 2019-04-29 MED ORDER — PENICILLIN G POT IN DEXTROSE 60000 UNIT/ML IV SOLN
3.0000 10*6.[IU] | INTRAVENOUS | Status: DC
  Administered 2019-04-29 (×4): 3 10*6.[IU] via INTRAVENOUS
  Filled 2019-04-29 (×4): qty 50

## 2019-04-29 MED ORDER — PHENYLEPHRINE 40 MCG/ML (10ML) SYRINGE FOR IV PUSH (FOR BLOOD PRESSURE SUPPORT)
80.0000 ug | PREFILLED_SYRINGE | INTRAVENOUS | Status: DC | PRN
  Filled 2019-04-29: qty 10

## 2019-04-29 MED ORDER — PRENATAL MULTIVITAMIN CH
1.0000 | ORAL_TABLET | Freq: Every day | ORAL | Status: DC
  Administered 2019-04-30 – 2019-05-01 (×2): 1 via ORAL
  Filled 2019-04-29 (×2): qty 1

## 2019-04-29 MED ORDER — LACTATED RINGERS IV SOLN: 500.0000 mL | Freq: Once | INTRAVENOUS | Status: DC

## 2019-04-29 MED ORDER — ONDANSETRON HCL 4 MG/2ML IJ SOLN: 4.0000 mg | INTRAMUSCULAR | Status: DC | PRN

## 2019-04-29 MED ORDER — TETANUS-DIPHTH-ACELL PERTUSSIS 5-2.5-18.5 LF-MCG/0.5 IM SUSP: 0.5000 mL | Freq: Once | INTRAMUSCULAR | Status: DC

## 2019-04-29 MED ORDER — FENTANYL-BUPIVACAINE-NACL 0.5-0.125-0.9 MG/250ML-% EP SOLN
12.0000 mL/h | EPIDURAL | Status: DC | PRN
  Filled 2019-04-29: qty 250

## 2019-04-29 MED ORDER — ONDANSETRON HCL 4 MG PO TABS: 4.0000 mg | ORAL_TABLET | ORAL | Status: DC | PRN

## 2019-04-29 MED ORDER — BENZOCAINE-MENTHOL 20-0.5 % EX AERO
1.0000 "application " | INHALATION_SPRAY | CUTANEOUS | Status: DC | PRN
  Administered 2019-04-30: 1 via TOPICAL
  Filled 2019-04-29: qty 56

## 2019-04-29 MED ORDER — COCONUT OIL OIL: 1.0000 "application " | TOPICAL_OIL | Status: DC | PRN

## 2019-04-29 MED ORDER — SODIUM CHLORIDE 0.9 % IV SOLN
5.0000 10*6.[IU] | Freq: Once | INTRAVENOUS | Status: AC
  Administered 2019-04-29: 01:00:00 5 10*6.[IU] via INTRAVENOUS
  Filled 2019-04-29: qty 5

## 2019-04-29 MED ORDER — TRANEXAMIC ACID-NACL 1000-0.7 MG/100ML-% IV SOLN
INTRAVENOUS | Status: AC
  Administered 2019-04-29: 1000 mg via INTRAVENOUS
  Filled 2019-04-29: qty 100

## 2019-04-29 MED ORDER — IBUPROFEN 600 MG PO TABS
600.0000 mg | ORAL_TABLET | Freq: Four times a day (QID) | ORAL | Status: DC
  Administered 2019-04-29 – 2019-05-01 (×7): 600 mg via ORAL
  Filled 2019-04-29 (×7): qty 1

## 2019-04-29 MED ORDER — METHYLERGONOVINE MALEATE 0.2 MG/ML IJ SOLN
INTRAMUSCULAR | Status: AC
  Administered 2019-04-29: 19:00:00 0.2 mg via INTRAMUSCULAR
  Filled 2019-04-29: qty 1

## 2019-04-29 MED ORDER — DIBUCAINE (PERIANAL) 1 % EX OINT: 1.0000 "application " | TOPICAL_OINTMENT | CUTANEOUS | Status: DC | PRN

## 2019-04-29 MED ORDER — FENTANYL CITRATE (PF) 100 MCG/2ML IJ SOLN
50.0000 ug | INTRAMUSCULAR | Status: DC | PRN
  Administered 2019-04-29: 05:00:00 50 ug via INTRAVENOUS
  Filled 2019-04-29: qty 2

## 2019-04-29 NOTE — Progress Notes (Signed)
OBGYN SVE 7/80/-1 FHR 150, intermittent late and early decels. Moderate variability. +scalp stimulation Discussed with patient that SVE is unchanged after 2 hours. MVU 140-210, s/p cytotec and AROM. Have not started pitocin due to intermittent late decels and has been changing. Reviewed strip with patient, her mom and RN Tresa Res. Discussed if having recurrent late decels not responsive to fluids/repositioning will proceed with CS or if unchanged after 4 hours will proceed with CS. Discussed the risks/benefits/alternatives of CS, including risks of infection, bleeding, damage to surrounding structures. Patient given opportunity for questions/concerns, voices understanding of plan.  Lempi Edwin K Taam-Akelman  04/29/19 2:40 PM

## 2019-04-29 NOTE — Progress Notes (Signed)
OBGYN Note After IUPC/FSE placed fetal testing showed late decels, despite position changes/fluid bolus, so terb was given. Now fetal testing Cat 1 and patient continues to contract regularly. Continue to monitor, will augment with pitocin prn.  Ashley Santana K Taam-Akelman 04/29/19 11:36 AM

## 2019-04-29 NOTE — Lactation Note (Addendum)
This note was copied from a baby's chart. Lactation Consultation Note Baby 3 hrs old. Just came up from L&D. Baby rooting. Mom gave baby pacifier. Discouraged for for 2 weeks. Assisted in latch in football position. Mom has tubular breast, semi flat compressible nipples. Hand expression taught w/glistening of colostrum noted. Baby latched well. No swallows heard.  Mom has PCOS. Will monitor the need for pumping.  Newborn behavior, feeding habits, STS, I&O, milk storage, positioning, support, supply and demand discussed. Encouraged mom to call for assistance or questions. Hand pump given for pre-pumping if needed. Lactation brochure given.  Patient Name: Ashley Santana [id number] Date: 04/29/2019 Reason for consult: Initial assessment;Primapara;Term   Maternal Data Does the patient have breastfeeding experience prior to this delivery?: No  Feeding Feeding Type: Breast Fed  LATCH Score Latch: Grasps breast easily, tongue down, lips flanged, rhythmical sucking.  Audible Swallowing: None  Type of Nipple: Flat(semi flat)  Comfort (Breast/Nipple): Soft / non-tender  Hold (Positioning): Assistance needed to correctly position infant at breast and maintain latch.  LATCH Score: 6  Interventions Interventions: Breast feeding basics reviewed;Support pillows;Assisted with latch;Position options;Skin to skin;Breast massage;Hand express;Breast compression;Adjust position;Hand pump  Lactation Tools Discussed/Used Tools: Pump Breast pump type: Manual WIC Program: Yes   Consult Status Consult Status: Follow-up Date: 04/30/19 Follow-up type: In-patient    Charyl Dancer 04/29/2019, 10:06 PM

## 2019-04-29 NOTE — Anesthesia Preprocedure Evaluation (Signed)
Anesthesia Evaluation  Patient identified by MRN, date of birth, ID band Patient awake    Reviewed: Allergy & Precautions, NPO status , Patient's Chart, lab work & pertinent test results  Airway Mallampati: III  TM Distance: >3 FB Neck ROM: Full    Dental  (+) Teeth Intact, Dental Advisory Given   Pulmonary neg pulmonary ROS,    Pulmonary exam normal breath sounds clear to auscultation       Cardiovascular negative cardio ROS Normal cardiovascular exam Rhythm:Regular Rate:Normal     Neuro/Psych  Headaches,    GI/Hepatic negative GI ROS, Neg liver ROS,   Endo/Other  Morbid obesity  Renal/GU negative Renal ROS     Musculoskeletal negative musculoskeletal ROS (+)   Abdominal   Peds  Hematology  (+) Blood dyscrasia, anemia , Plt 219k   Anesthesia Other Findings Day of surgery medications reviewed with the patient.  Reproductive/Obstetrics (+) Pregnancy                             Anesthesia Physical Anesthesia Plan  ASA: III  Anesthesia Plan: Epidural   Post-op Pain Management:    Induction:   PONV Risk Score and Plan: 2 and Treatment may vary due to age or medical condition  Airway Management Planned: Natural Airway  Additional Equipment:   Intra-op Plan:   Post-operative Plan:   Informed Consent: I have reviewed the patients History and Physical, chart, labs and discussed the procedure including the risks, benefits and alternatives for the proposed anesthesia with the patient or authorized representative who has indicated his/her understanding and acceptance.     Dental advisory given  Plan Discussed with:   Anesthesia Plan Comments: (Patient identified. Risks/Benefits/Options discussed with patient including but not limited to bleeding, infection, nerve damage, paralysis, failed block, incomplete pain control, headache, blood pressure changes, nausea, vomiting, reactions  to medication both or allergic, itching and postpartum back pain. Confirmed with bedside nurse the patient's most recent platelet count. Confirmed with patient that they are not currently taking any anticoagulation, have any bleeding history or any family history of bleeding disorders. Patient expressed understanding and wished to proceed. All questions were answered. )        Anesthesia Quick Evaluation

## 2019-04-29 NOTE — Progress Notes (Signed)
OBGYN note SVE 5/100/-1 FSE and IUPC placed due to possible late decels but difficult to differentiate on the external monitors FHR 150, possible late vs. Early decels Toco q50m -Cont to monitor  Ashley Santana K Taam-Akelman 04/29/19 9:41 AM

## 2019-04-29 NOTE — Anesthesia Procedure Notes (Signed)
Epidural Patient location during procedure: OB Start time: 04/29/2019 7:48 AM End time: 04/29/2019 7:55 AM  Staffing Anesthesiologist: Cecile Hearing, MD Performed: anesthesiologist   Preanesthetic Checklist Completed: patient identified, IV checked, risks and benefits discussed, monitors and equipment checked, pre-op evaluation and timeout performed  Epidural Patient position: sitting Prep: DuraPrep Patient monitoring: blood pressure and continuous pulse ox Approach: midline Location: L3-L4 Injection technique: LOR air  Needle:  Needle type: Tuohy  Needle gauge: 17 G Needle length: 9 cm Needle insertion depth: 7 cm Catheter size: 19 Gauge Catheter at skin depth: 12 cm Test dose: negative and Other (1% Lidocaine)  Additional Notes Patient identified.  Risk benefits discussed including failed block, incomplete pain control, headache, nerve damage, paralysis, blood pressure changes, nausea, vomiting, reactions to medication both toxic or allergic, and postpartum back pain.  Patient expressed understanding and wished to proceed.  All questions were answered.  Sterile technique used throughout procedure and epidural site dressed with sterile barrier dressing. No paresthesia or other complications noted. The patient did not experience any signs of intravascular injection such as tinnitus or metallic taste in mouth nor signs of intrathecal spread such as rapid motor block. Please see nursing notes for vital signs. Reason for block:procedure for pain

## 2019-04-29 NOTE — Progress Notes (Signed)
OBGYN Note Comfortable with epidural SVE 4/100/-2, AROM with minimal fluid FHR 150, +moderate variability, early decels Toco q2-3 -Augment with pitocin prn Tameah Mihalko K Taam-Akelman 04/29/19 8:51 AM

## 2019-04-29 NOTE — Progress Notes (Signed)
Alerted by RN for late and variables decels that have become more frequent. Pt had 1 dose of cytotec approx 1am.  She reports that over night there had been episodes of decels that resolved with fluid bolus and position change, but that recently these measures have not been helping.  She states that she is not able to pick up contractions well, but has been in the room with patient and able to palpate contractions to approximate timing and feels they are mainly late and some variable decels.  I reviewed the strip and asked her to administer one dose of terbutaline.  I called the oncoming Dr. Claudine Mouton who has also reviewed the strip, is aware terb has been given and is on her way to the hospital to further assess.

## 2019-04-29 NOTE — Progress Notes (Signed)
OBGYN Note Patient called out with pressure SVE 8/100/0 FHR 135, +accels, moderate variability, occasional late decels but spontaneous resolve, overall Cat 1 tracing Toco q2-44m -Anticipate SVD

## 2019-04-29 NOTE — H&P (Addendum)
Ashley Santana is a 29 y.o. female G1P0 [redacted]w[redacted]d presenting for elective IOL. On admission, she reports no LOF, VB, or contractions. Normal FM.   Pregnancy c/b 1. Obesity: BMI 43  OB History    Gravida  1   Para      Term      Preterm      AB      Living        SAB      TAB      Ectopic      Multiple      Live Births             Past Medical History:  Diagnosis Date  . Headache    Migraines   Past Surgical History:  Procedure Laterality Date  . NO PAST SURGERIES     Family History: family history includes Diabetes in her maternal uncle and paternal uncle. Social History:  reports that she has never smoked. She has never used smokeless tobacco. She reports that she does not drink alcohol or use drugs.     Maternal Diabetes: No Genetic Screening: Normal - [id number] Maternal Ultrasounds/Referrals: Normal 03/25/2019 EFW 63% anterior placenta Fetal Ultrasounds or other Referrals:  None Maternal Substance Abuse:  No Significant Maternal Medications:  None Significant Maternal Lab Results:  Group B Strep positive Other Comments:  None  Review of Systems Per HPI Exam Physical Exam  Dilation: 3 Effacement (%): 50 Station: -3 Exam by:: [employer] RN Blood pressure 126/70, pulse 79, temperature 98.2 F (36.8 C), resp. rate 17, height 5\' 2"  (1.575 m), weight 107 kg. NAD, painful with contractions Gravid abdomen Fetal testing: FHR 150, +moderate variability, Cat 1. Toco q40m Prenatal labs: ABO, Rh:  --/--/O POS (04/14 0056) Antibody: NEG (04/14 0056) Rubella: Immune (09/28 0000) RPR: Nonreactive (09/28 0000)  HBsAg: Negative (09/28 0000)  HIV: Non-reactive (09/28 0000)  GBS: Positive/-- (09/28 0000)   Recent Labs    04/29/19 0102  WBC 7.3  HGB 11.6*  HCT 34.7*  PLT 219   Assessment/Plan: Ashley Santana 29 y.o. G1P0 at [redacted]w[redacted]d here for elective IOL 1. IOL: On admit 2.5/50/-3, received cytotec x1. Since then she has had late and variable decels,  initially resolving with fluid bolus and position change, but then became more frequent and terb was given at 7:09am, epidural was placed, now reassuring testing, and contracting regularly, will augment with pitocin/AROM prn. Elevated BP at time of epidural due to pain, continue to monitor 2. Obesity: BMI 43  Chael Urenda K Taam-Akelman 04/29/2019, 7:56 AM

## 2019-04-30 LAB — CBC
HCT: 25.8 % — ABNORMAL LOW (ref 36.0–46.0)
Hemoglobin: 8.6 g/dL — ABNORMAL LOW (ref 12.0–15.0)
MCH: 28.7 pg (ref 26.0–34.0)
MCHC: 33.3 g/dL (ref 30.0–36.0)
MCV: 86 fL (ref 80.0–100.0)
Platelets: 154 10*3/uL (ref 150–400)
RBC: 3 MIL/uL — ABNORMAL LOW (ref 3.87–5.11)
RDW: 14.6 % (ref 11.5–15.5)
WBC: 10.6 10*3/uL — ABNORMAL HIGH (ref 4.0–10.5)
nRBC: 0 % (ref 0.0–0.2)

## 2019-04-30 MED ORDER — FERROUS SULFATE 325 (65 FE) MG PO TABS
325.0000 mg | ORAL_TABLET | Freq: Two times a day (BID) | ORAL | Status: DC
  Administered 2019-04-30 – 2019-05-01 (×2): 325 mg via ORAL
  Filled 2019-04-30 (×2): qty 1

## 2019-04-30 NOTE — Anesthesia Postprocedure Evaluation (Signed)
Anesthesia Post Note  Patient: Science writer  Procedure(s) Performed: AN AD HOC LABOR EPIDURAL     Patient location during evaluation: Mother Baby Anesthesia Type: Epidural Level of consciousness: awake and alert and oriented Pain management: satisfactory to patient Vital Signs Assessment: post-procedure vital signs reviewed and stable Respiratory status: respiratory function stable Cardiovascular status: stable Postop Assessment: no headache, no backache, epidural receding, patient able to bend at knees, no signs of nausea or vomiting and adequate PO intake Anesthetic complications: no    Last Vitals:  Vitals:   04/30/19 0140 04/30/19 0512  BP: (!) 98/53 90/60  Pulse: 96 91  Resp: 17 18  Temp: 37.2 C 37.2 C  SpO2: 100% 100%    Last Pain:  Vitals:   04/30/19 0722  TempSrc:   PainSc: 6    Pain Goal:                   Abrina Petz

## 2019-04-30 NOTE — Progress Notes (Signed)
PPD#1 Pt states that she is doing well. She has mild lochia. No pain VSSAF Hgb-8.6 IMP/ anemia Plan/ Will start iron

## 2019-04-30 NOTE — Lactation Note (Signed)
This note was copied from a baby's chart. Lactation Consultation Note  Patient Name: Ashley Santana [id number] Date: 04/30/2019 Reason for consult: Follow-up assessment;Term;Primapara;1st time breastfeeding;Maternal endocrine disorder Type of Endocrine Disorder?: PCOS  P1 mother whose infant is now 55 hours old.  This is a term baby at 39+1 weeks.  Baby was asleep in father's arms when I arrived.  Mother has been taught hand expression.  Encouraged to feed 8-12 times/24 hours or sooner if baby shows feeding cues.  Mother had no questions/concerns related to breast feeding at this time.  She is very sleepy.    Offered to initiate the DEBP due to a history of PCOS.  Mother interested in beginning to pump.  Pump parts, assembly, disassembly and cleaning reviewed.  Milk storage times discussed and finger feeding demonstrated.  Reminded mother to feed back any EBM she obtains to baby.  Encouraged mother to ask her RN/LC for latch assistance as needed.  Mother has a DEBP for home use.  Manual pump in the DEBP kit but not yet shown to mother due to pumping at this time.  Will need to review the manual pump prior to discharge.     Maternal Data Formula Feeding for Exclusion: No Has patient been taught Hand Expression?: Yes Does the patient have breastfeeding experience prior to this delivery?: No  Feeding Feeding Type: Breast Fed  LATCH Score                   Interventions    Lactation Tools Discussed/Used Tools: (P) Pump;Flanges Pump Review: Setup, frequency, and cleaning;Milk Storage Initiated by:: Humna Moorehouse Date initiated:: 04/30/19   Consult Status Consult Status: Follow-up Date: 05/01/19 Follow-up type: In-patient    Catarino Vold R Adar Rase 04/30/2019, 6:06 PM

## 2019-05-01 MED ORDER — IBUPROFEN 600 MG PO TABS: 600.0000 mg | ORAL_TABLET | Freq: Four times a day (QID) | ORAL | 0 refills | Status: AC

## 2019-05-01 MED ORDER — OXYCODONE HCL 5 MG PO TABS: 5.0000 mg | ORAL_TABLET | Freq: Four times a day (QID) | ORAL | 0 refills | Status: AC | PRN

## 2019-05-01 NOTE — Lactation Note (Signed)
This note was copied from a baby's chart. Lactation Consultation Note  Patient Name: Ashley Santana Date: 05/01/2019 Reason for consult: Follow-up assessment;Term;Primapara;1st time breastfeeding Type of Endocrine Disorder?: PCOS  P1 mother whose infant is now 45 hours old.  This is a term baby at 39+1 weeks.  Mother stated that baby had a "hard" night; she seemed to be hungry often and was probably cluster feeding.  Baby was asleep in the bassinet when I arrived.    Mother had been set up with the DEBP yesterday but has not pumped much due to caring and feeding her baby often.  Encouraged her to continue feeding on cue and then to pump for 15 minutes after feedings.  Mother was able to provide 5 mls of EBM at the last feeding.    Mother's breakfast arrived while I was visiting with her.  Suggested she finish her breakfast, awaken baby to feed and then post pump.  Mother desires a discharge today and baby's bilirubin will be checked at 1245 before being allowed to be discharged.  Encouraged frequent feedings.  Mother has been taught hand expression.  Suggested she call for latch assistance when baby is ready to feed.    Mother has a DEBP for home use.  Grandmother present.  RN updated.   Maternal Data Formula Feeding for Exclusion: No Has patient been taught Hand Expression?: Yes Does the patient have breastfeeding experience prior to this delivery?: No  Feeding    LATCH Score                   Interventions    Lactation Tools Discussed/Used Tools: Pump;Flanges Flange Size: 24 Breast pump type: Double-Electric Breast Pump;Manual   Consult Status Consult Status: Follow-up Date: 05/01/19 Follow-up type: In-patient    Kierra Jezewski R Arianny Pun 05/01/2019, 9:26 AM

## 2019-05-01 NOTE — Lactation Note (Signed)
This note was copied from a baby's chart. Lactation Consultation Note Attempted to see mom. Mom stated she was tired. Older female holding sleeping baby. Mom stated she was ok.  Patient Name: Ashley Santana Today's Date: 05/01/2019     Maternal Data    Feeding Feeding Type: Breast Fed  LATCH Score Latch: Grasps breast easily, tongue down, lips flanged, rhythmical sucking.  Audible Swallowing: A few with stimulation  Type of Nipple: Everted at rest and after stimulation  Comfort (Breast/Nipple): Soft / non-tender  Hold (Positioning): No assistance needed to correctly position infant at breast.  LATCH Score: 9  Interventions    Lactation Tools Discussed/Used     Consult Status      Ara Mano G 05/01/2019, 2:28 AM

## 2019-05-01 NOTE — Discharge Summary (Signed)
Obstetric Discharge Summary Reason for Admission: induction of labor, elective Prenatal Procedures: none Intrapartum Procedures: spontaneous vaginal delivery Postpartum Procedures: none Complications-Operative and Postpartum: 2 degree perineal laceration and uterine atony after delivery requiring methergine and hemabate, EBL Hemoglobin  Date Value Ref Range Status  04/30/2019 8.6 (L) 12.0 - 15.0 g/dL Final    Comment:    REPEATED TO VERIFY   HCT  Date Value Ref Range Status  04/30/2019 25.8 (L) 36.0 - 46.0 % Final    Physical Exam:  General: alert, cooperative and appears stated age 89: appropriate Uterine Fundus: firm DVT Evaluation: No evidence of DVT seen on physical exam.  Discharge Diagnoses: Term Pregnancy-delivered  Discharge Information: Date: 05/01/2019 Activity: pelvic rest Diet: routine Medications: PNV, Ibuprofen and oxycodone Condition: stable Instructions: refer to practice specific booklet Discharge to: home Follow-up Information    Rande Brunt, MD Follow up.   Specialty: Obstetrics and Gynecology Contact information: [address] [place] [address] [phone]           Newborn Data: Live born female  Birth Weight: 7 lb 0.3 oz (3184 g) APGAR: 8, 9  Newborn Delivery   Birth date/time: 04/29/2019 18:44:00 Delivery type: Vaginal, Spontaneous      Home with mother.  Timon Geissinger GEFFEL Sharna Gabrys 05/01/2019, 8:57 AM

## 2019-05-01 NOTE — Progress Notes (Signed)
Patient is doing well.  She is ambulating, voiding, tolerating PO.  Pain control is good.  Lochia is appropriate  Vitals:   04/30/19 0730 04/30/19 1503 04/30/19 2200 05/01/19 0530  BP: 111/60 111/66 117/67 109/63  Pulse: 87 98 (!) 117 74  Resp: 18  17 16   Temp: 98.6 F (37 C) 98.5 F (36.9 C) 98.7 F (37.1 C) 97.8 F (36.6 C)  TempSrc: Oral Oral Axillary Axillary  SpO2: 98%  100%   Weight:      Height:        NAD Fundus firm Ext: trace edema bilaterally  Lab Results  Component Value Date   WBC 10.6 (H) 04/30/2019   HGB 8.6 (L) 04/30/2019   HCT 25.8 (L) 04/30/2019   MCV 86.0 04/30/2019   PLT 154 04/30/2019    --/--/O POS (04/14 0056)/RImmune  A/P 29 y.o. G1P1001 PPD#2 Routine care.   hgb 8.6--mild abla from delivery, PO iron  Meeting all goals, d/c to home today    [facility] GEFFEL [place]

## 2020-03-30 ENCOUNTER — Other Ambulatory Visit: Payer: Self-pay

## 2020-03-30 ENCOUNTER — Emergency Department (HOSPITAL_BASED_OUTPATIENT_CLINIC_OR_DEPARTMENT_OTHER)
Admission: EM | Admit: 2020-03-30 | Discharge: 2020-03-30 | Disposition: A | Discharge status: Home / Self Care | Payer: Medicaid Other | Attending: Emergency Medicine | Admitting: Emergency Medicine

## 2020-03-30 ENCOUNTER — Emergency Department (HOSPITAL_BASED_OUTPATIENT_CLINIC_OR_DEPARTMENT_OTHER): Payer: Medicaid Other

## 2020-03-30 ENCOUNTER — Encounter (HOSPITAL_BASED_OUTPATIENT_CLINIC_OR_DEPARTMENT_OTHER): Payer: Self-pay | Admitting: Emergency Medicine

## 2020-03-30 DIAGNOSIS — Y9302 Activity, running: Secondary | ICD-10-CM | POA: Insufficient documentation

## 2020-03-30 DIAGNOSIS — X501XXA Overexertion from prolonged static or awkward postures, initial encounter: Secondary | ICD-10-CM | POA: Diagnosis not present

## 2020-03-30 DIAGNOSIS — M25571 Pain in right ankle and joints of right foot: Secondary | ICD-10-CM | POA: Diagnosis present

## 2020-03-30 NOTE — ED Provider Notes (Signed)
[facility] EMERGENCY DEPARTMENT Provider Note   CSN: [id number] Arrival date & time: 03/30/20  1547     History Chief Complaint  Patient presents with  . Ankle Injury    right    Ashley Santana is a 30 y.o. female.  HPI      Ashley Santana is a 30 y.o. female, with a history of migraines, presenting to the ED with right ankle pain and swelling noted yesterday. Patient states she noted the pain and swelling to the ankle while running around with her toddler. She has taken 1 dose of Tylenol for her pain without resolution.  Denies numbness, weakness, other lower extremity pain, or any other complaints.   Past Medical History:  Diagnosis Date  . Headache    Migraines    Patient Active Problem List   Diagnosis Date Noted  . [redacted] weeks gestation of pregnancy 04/29/2019    Past Surgical History:  Procedure Laterality Date  . NO PAST SURGERIES       OB History    Gravida  1   Para  1   Term  1   Preterm      AB      Living  1     SAB      IAB      Ectopic      Multiple  0   Live Births  1           Family History  Problem Relation Age of Onset  . Diabetes Maternal Uncle   . Diabetes Paternal Uncle     Social History   Tobacco Use  . Smoking status: Never Smoker  . Smokeless tobacco: Never Used  Vaping Use  . Vaping Use: Never used  Substance Use Topics  . Alcohol use: No  . Drug use: No    Home Medications Prior to Admission medications   Medication Sig Start Date End Date Taking? Authorizing Provider  ibuprofen (ADVIL) 600 MG tablet Take 1 tablet (600 mg total) by mouth every 6 (six) hours. 05/01/19   Marlow Baars, MD  oxyCODONE (OXY IR/ROXICODONE) 5 MG immediate release tablet Take 1 tablet (5 mg total) by mouth every 6 (six) hours as needed for severe pain. 05/01/19   Marlow Baars, MD  Prenatal Vit-Fe Fumarate-FA (PRENATAL MULTIVITAMIN) TABS tablet Take 1 tablet by mouth daily at 12 noon.    [provider]     Allergies    Patient has no known allergies.  Review of Systems   Review of Systems  Musculoskeletal: Positive for arthralgias and joint swelling.  Neurological: Negative for weakness and numbness.    Physical Exam Updated Vital Signs BP 112/74 (BP Location: Right Arm)   Pulse (!) 106   Temp 98.5 F (36.9 C) (Oral)   Resp 18   Ht 5\' 2"  (1.575 m)   Wt 104.3 kg   LMP 02/24/2020   SpO2 99%   BMI 42.07 kg/m   Physical Exam Vitals and nursing note reviewed.  Constitutional:      General: She is not in acute distress.    Appearance: She is well-developed. She is not diaphoretic.  HENT:     Head: Normocephalic and atraumatic.  Eyes:     Conjunctiva/sclera: Conjunctivae normal.  Cardiovascular:     Rate and Rhythm: Normal rate and regular rhythm.     Pulses:          Dorsalis pedis pulses are 2+ on the right side.  Posterior tibial pulses are 2+ on the right side.  Pulmonary:     Effort: Pulmonary effort is normal.  Musculoskeletal:     Cervical back: Neck supple.     Comments: Tenderness and swelling over the right lateral malleolus without deformity or instability. No tenderness, swelling, deformity into the right foot, right lower leg.  Full range of motion in the right knee without pain or difficulty.  Skin:    General: Skin is warm and dry.     Capillary Refill: Capillary refill takes less than 2 seconds.     Coloration: Skin is not pale.  Neurological:     Mental Status: She is alert.     Comments: Sensation to light touch grossly intact in the right foot and toes. Appropriate motor function intact in the right ankle and toes.  Psychiatric:        Behavior: Behavior normal.     ED Results / Procedures / Treatments   Labs (all labs ordered are listed, but only abnormal results are displayed) Labs Reviewed - No data to display  EKG None  Radiology DG Ankle Complete Right  Result Date: 03/30/2020 CLINICAL DATA:  Status post trauma. EXAM: RIGHT  ANKLE - COMPLETE 3+ VIEW COMPARISON:  None. FINDINGS: There is no evidence of fracture, dislocation, or joint effusion. There is no evidence of arthropathy or other focal bone abnormality. Mild diffuse soft tissue swelling is seen. IMPRESSION: Mild diffuse soft tissue swelling without evidence of acute fracture or dislocation. Electronically Signed   By: Aram Candela M.D.   On: 03/30/2020 16:52    Procedures Procedures   Medications Ordered in ED Medications - No data to display  ED Course  I have reviewed the triage vital signs and the nursing notes.  Pertinent labs & imaging results that were available during my care of the patient were reviewed by me and considered in my medical decision making (see chart for details).    MDM Rules/Calculators/A&P                          She presents with right ankle pain and swelling. No evidence of neurovascular compromise. No acute osseous abnormality on x-ray. Placed in cam boot, given crutches, orthopedic follow-up. The patient was given instructions for home care as well as return precautions. Patient voices understanding of these instructions, accepts the plan, and is comfortable with discharge.    Final Clinical Impression(s) / ED Diagnoses Final diagnoses:  Acute right ankle pain    Rx / DC Orders ED Discharge Orders    None       Concepcion Living 03/30/20 1830    Benjiman Core, MD 03/30/20 2306

## 2020-03-30 NOTE — Discharge Instructions (Signed)
You have been seen today for an ankle injury. There were no acute abnormalities on the x-rays, including no sign of fracture or dislocation, however, there could be injuries to the soft tissues, such as the ligaments or tendons that are not seen on xrays. There could also be what are called occult fractures that are small fractures not seen on xray. Antiinflammatory medications: Take 600 mg of ibuprofen every 6 hours or 440 mg (over the counter dose) to 500 mg (prescription dose) of naproxen every 12 hours for the next 3 days. After this time, these medications may be used as needed for pain. Take these medications with food to avoid upset stomach. Choose only one of these medications, do not take them together. Acetaminophen (generic for Tylenol): Should you continue to have additional pain while taking the ibuprofen or naproxen, you may add in acetaminophen as needed. Your daily total maximum amount of acetaminophen from all sources should be limited to 4000mg /day for persons without liver problems, or 2000mg /day for those with liver problems. Ice: May apply ice to the area over the next 24 hours for 15 minutes at a time to reduce swelling. Elevation: Keep the extremity elevated as often as possible to reduce pain and inflammation. Support: Wear the boot for support and comfort. Wear this until pain resolves. You will be weight-bearing as tolerated, which means you can slowly start to put weight on the extremity and increase amount and frequency as pain allows. Follow up: If symptoms are improving, you may follow up with your primary care provider for any continued management. If symptoms are not starting to improve within a week, you should follow up with the orthopedic specialist within two weeks. Return: Return to the ED for numbness, weakness, increasing pain, overall worsening symptoms, loss of function, or if symptoms are not improving, you have tried to follow up with the orthopedic specialist, and  have been unable to do so.  For prescription assistance, may try using prescription discount sites or apps, such as goodrx.com or Good Rx smart phone app.

## 2020-03-30 NOTE — ED Notes (Signed)
ED Provider at bedside. 

## 2020-03-30 NOTE — ED Notes (Signed)
PT walked on crutches and Stated no further questions with crutches or Cam Walker. Pt verbalized understanding of all this techs instructions and knows to follow up with orthopedist or come back to ER with any unresolved issues.

## 2020-03-30 NOTE — ED Triage Notes (Signed)
Right ankle injury, twisted it when running /walking, painful weight bearing

## 2021-07-22 ENCOUNTER — Emergency Department (HOSPITAL_BASED_OUTPATIENT_CLINIC_OR_DEPARTMENT_OTHER): Payer: Medicaid Other

## 2021-07-22 ENCOUNTER — Emergency Department (HOSPITAL_BASED_OUTPATIENT_CLINIC_OR_DEPARTMENT_OTHER)
Admission: EM | Admit: 2021-07-22 | Discharge: 2021-07-23 | Disposition: A | Discharge status: Home / Self Care | Payer: Medicaid Other | Attending: Emergency Medicine | Admitting: Emergency Medicine

## 2021-07-22 ENCOUNTER — Encounter (HOSPITAL_BASED_OUTPATIENT_CLINIC_OR_DEPARTMENT_OTHER): Payer: Self-pay | Admitting: Emergency Medicine

## 2021-07-22 ENCOUNTER — Other Ambulatory Visit: Payer: Self-pay

## 2021-07-22 DIAGNOSIS — X58XXXA Exposure to other specified factors, initial encounter: Secondary | ICD-10-CM | POA: Diagnosis not present

## 2021-07-22 DIAGNOSIS — S99921A Unspecified injury of right foot, initial encounter: Secondary | ICD-10-CM

## 2021-07-22 DIAGNOSIS — S90111A Contusion of right great toe without damage to nail, initial encounter: Secondary | ICD-10-CM | POA: Diagnosis not present

## 2021-07-22 DIAGNOSIS — T148XXA Other injury of unspecified body region, initial encounter: Secondary | ICD-10-CM

## 2021-07-22 DIAGNOSIS — Y9364 Activity, baseball: Secondary | ICD-10-CM | POA: Insufficient documentation

## 2021-07-22 NOTE — ED Triage Notes (Signed)
Reports she stubbed her right great toe going up the steps yesterday.  Having continued pain.

## 2021-07-22 NOTE — Discharge Instructions (Signed)
Ice for the next 24 hours and switch to heat.  Tylenol, Motrin as needed for pain.  Make sure to elevate the foot  If persistent or worsening pain or swelling, you notice redness, warmth to the foot please seek reevaluation

## 2021-07-22 NOTE — ED Provider Notes (Signed)
[facility] EMERGENCY DEPARTMENT Provider Note   CSN: [id number] Arrival date & time: 07/22/21  2224     History  Chief Complaint  Patient presents with   Toe Injury    Ashley Santana is a 31 y.o. female without medical problems here for evaluation of right great toe pain.  Patient states she stepped her toe going up steps yesterday at the baseball game.  She continues to have pain with overlying swelling and bruising to the distal toe.  She has no nailbed damage.  No numbness or weakness.  No meds PTA.  No redness or warmth.    HPI     Home Medications Prior to Admission medications   Medication Sig Start Date End Date Taking? Authorizing Provider  ibuprofen (ADVIL) 600 MG tablet Take 1 tablet (600 mg total) by mouth every 6 (six) hours. 05/01/19   Marlow Baars, MD  oxyCODONE (OXY IR/ROXICODONE) 5 MG immediate release tablet Take 1 tablet (5 mg total) by mouth every 6 (six) hours as needed for severe pain. 05/01/19   Marlow Baars, MD  Prenatal Vit-Fe Fumarate-FA (PRENATAL MULTIVITAMIN) TABS tablet Take 1 tablet by mouth daily at 12 noon.    [provider]      Allergies    Patient has no known allergies.    Review of Systems   Review of Systems  Constitutional: Negative.   HENT: Negative.    Respiratory: Negative.    Cardiovascular: Negative.   Gastrointestinal: Negative.   Genitourinary: Negative.   Musculoskeletal:        Right great toe pain  Skin: Negative.   Neurological: Negative.   All other systems reviewed and are negative.   Physical Exam Updated Vital Signs BP (!) 145/82 (BP Location: Left Arm)   Pulse (!) 101   Temp 98.4 F (36.9 C) (Oral)   Resp 15   Ht 5\' 2"  (1.575 m)   Wt 117.9 kg   LMP 06/29/2021   SpO2 98%   BMI 47.55 kg/m  Physical Exam Vitals and nursing note reviewed.  Constitutional:      General: She is not in acute distress.    Appearance: She is well-developed. She is not ill-appearing, toxic-appearing or  diaphoretic.  HENT:     Head: Normocephalic and atraumatic.     Nose: Nose normal.     Mouth/Throat:     Mouth: Mucous membranes are moist.  Eyes:     Pupils: Pupils are equal, round, and reactive to light.  Cardiovascular:     Rate and Rhythm: Normal rate.     Pulses: Normal pulses.          Radial pulses are 2+ on the right side and 2+ on the left side.       Dorsalis pedis pulses are 2+ on the right side and 2+ on the left side.  Pulmonary:     Effort: Pulmonary effort is normal. No respiratory distress.     Breath sounds: Normal breath sounds.  Abdominal:     General: There is no distension.  Musculoskeletal:        General: Normal range of motion.     Cervical back: Normal range of motion.     Comments: Tenderness to distal right great toe. No subungual hematoma.  Skin:    General: Skin is warm and dry.     Comments: Ecchymosis distal plantar aspect right great toe.  She has some violaceous coloring to distal digit consistent with likely small collection of  blood.  No laceration to suture.  No felon, paronychia, subungual hematoma  Neurological:     General: No focal deficit present.     Mental Status: She is alert.     Sensory: Sensation is intact.     Motor: Motor function is intact.     Gait: Gait is intact.  Psychiatric:        Mood and Affect: Mood normal.     ED Results / Procedures / Treatments   Labs (all labs ordered are listed, but only abnormal results are displayed) Labs Reviewed - No data to display  EKG None  Radiology DG Toe Great Right  Result Date: 07/22/2021 CLINICAL DATA:  Blunt trauma to the first toe while climbing stairs yesterday with persistent pain, initial encounter EXAM: RIGHT GREAT TOE COMPARISON:  None Available. FINDINGS: There is no evidence of fracture or dislocation. There is no evidence of arthropathy or other focal bone abnormality. Soft tissues are unremarkable. IMPRESSION: No acute abnormality noted. Electronically Signed   By:  Alcide Clever M.D.   On: 07/22/2021 23:21    Procedures Procedures    Medications Ordered in ED Medications - No data to display  ED Course/ Medical Decision Making/ A&P    31 year old without chronic medical problems here for evaluation of right great toe injury after stubbing toe while walking up steps at a baseball game yesterday evening.  She has had persistent pain and swelling to the area.  She has no nailbed damage.  Neurovascular intact, full range of motion.  Does have ecchymosis, swelling and violaceous erythema under skin at distal plantar aspect right great toe.  No obvious infectious process, felon, laceration.   Imaging personally viewed and interpreted:  No fracture, dislocation   I offered given area consistent with likely blood pooling under skin with moderate pain, to I& D this area to relieve the pressure and help with pain.  Discussed risk versus benefit with patient.  Patient declines intervention at this time.  She prefers to try elevating the foot at home, NSAIDs and return if her symptoms do not improve.  I feel this is reasonable given does not appear actively infected at this time. Encouraged return if her symptoms do not improve or worsen.  The patient has been appropriately medically screened and/or stabilized in the ED. I have low suspicion for any other emergent medical condition which would require further screening, evaluation or treatment in the ED or require inpatient management.  Patient is hemodynamically stable and in no acute distress.  Patient able to ambulate in department prior to ED.  Evaluation does not show acute pathology that would require ongoing or additional emergent interventions while in the emergency department or further inpatient treatment.  I have discussed the diagnosis with the patient and answered all questions.  Pain is been managed while in the emergency department and patient has no further complaints prior to discharge.  Patient is  comfortable with plan discussed in room and is stable for discharge at this time.  I have discussed strict return precautions for returning to the emergency department.  Patient was encouraged to follow-up with PCP/specialist refer to at discharge.                           Medical Decision Making Amount and/or Complexity of Data Reviewed External Data Reviewed: labs, radiology and notes. Radiology: ordered and independent interpretation performed. Decision-making details documented in ED Course.  Risk OTC drugs.  Diagnosis or treatment significantly limited by social determinants of health.          Final Clinical Impression(s) / ED Diagnoses Final diagnoses:  Injury of right great toe, initial encounter  Hematoma    Rx / DC Orders ED Discharge Orders     None         Hadiyah Maricle A, PA-C 07/22/21 2342    Charlynne Pander, MD 07/23/21 1446

## 2021-11-22 ENCOUNTER — Other Ambulatory Visit: Payer: Self-pay

## 2021-11-22 ENCOUNTER — Emergency Department (HOSPITAL_BASED_OUTPATIENT_CLINIC_OR_DEPARTMENT_OTHER)
Admission: EM | Admit: 2021-11-22 | Discharge: 2021-11-22 | Disposition: A | Discharge status: Home / Self Care | Payer: 59 | Attending: Emergency Medicine | Admitting: Emergency Medicine

## 2021-11-22 ENCOUNTER — Emergency Department (HOSPITAL_BASED_OUTPATIENT_CLINIC_OR_DEPARTMENT_OTHER): Payer: 59

## 2021-11-22 ENCOUNTER — Encounter (HOSPITAL_BASED_OUTPATIENT_CLINIC_OR_DEPARTMENT_OTHER): Payer: Self-pay | Admitting: Pediatrics

## 2021-11-22 DIAGNOSIS — M25531 Pain in right wrist: Secondary | ICD-10-CM | POA: Diagnosis present

## 2021-11-22 DIAGNOSIS — G5601 Carpal tunnel syndrome, right upper limb: Secondary | ICD-10-CM | POA: Insufficient documentation

## 2021-11-22 MED ORDER — PREDNISONE 50 MG PO TABS
60.0000 mg | ORAL_TABLET | Freq: Once | ORAL | Status: AC
  Administered 2021-11-22: 60 mg via ORAL
  Filled 2021-11-22: qty 1

## 2021-11-22 MED ORDER — PREDNISONE 20 MG PO TABS: ORAL_TABLET | ORAL | 0 refills | Status: AC

## 2021-11-22 MED ORDER — HYDROCODONE-ACETAMINOPHEN 5-325 MG PO TABS: 1.0000 | ORAL_TABLET | Freq: Four times a day (QID) | ORAL | 0 refills | Status: AC | PRN

## 2021-11-22 MED ORDER — IBUPROFEN 800 MG PO TABS
800.0000 mg | ORAL_TABLET | Freq: Once | ORAL | Status: AC
  Administered 2021-11-22: 800 mg via ORAL
  Filled 2021-11-22: qty 1

## 2021-11-22 NOTE — Discharge Instructions (Addendum)
As we discussed, your x-ray did not show any fracture and you likely have carpal tunnel syndrome  I recommend taking prednisone and take 800 mg of ibuprofen for pain  If you have severe pain I recommend taking Vicodin  You need to call orthopedic doctor tomorrow to arrange for appointment  Return to ER if you have worse hand pain, wrist pain

## 2021-11-22 NOTE — ED Provider Notes (Signed)
[facility] EMERGENCY DEPARTMENT Provider Note   CSN: [id number] Arrival date & time: 11/22/21  1739     History  Chief Complaint  Patient presents with   Hand Pain    Ashley Santana is a 31 y.o. female here presenting with right hand and wrist pain.  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  She states that for the last 2 weeks she has been having pain in her wrist and also pain in her hand.  She states that she is more comfortable flexing her fingers and has trouble extending them.  Denies any trauma or injury  The history is provided by the patient.       Home Medications Prior to Admission medications   Medication Sig Start Date End Date Taking? Authorizing Provider  ibuprofen (ADVIL) 600 MG tablet Take 1 tablet (600 mg total) by mouth every 6 (six) hours. 05/01/19   Marlow Baars, MD  oxyCODONE (OXY IR/ROXICODONE) 5 MG immediate release tablet Take 1 tablet (5 mg total) by mouth every 6 (six) hours as needed for severe pain. 05/01/19   Marlow Baars, MD  Prenatal Vit-Fe Fumarate-FA (PRENATAL MULTIVITAMIN) TABS tablet Take 1 tablet by mouth daily at 12 noon.    [provider]      Allergies    Patient has no known allergies.    Review of Systems   Review of Systems  Musculoskeletal:        R hand pain   All other systems reviewed and are negative.   Physical Exam Updated Vital Signs BP 126/82 (BP Location: Left Arm)   Pulse 90   Temp 98.5 F (36.9 C) (Oral)   Resp 20   Ht 5\' 2"  (1.575 m)   Wt 117.9 kg   LMP 10/22/2021   SpO2 98%   BMI 47.55 kg/m  Physical Exam Vitals and nursing note reviewed.  HENT:     Head: Normocephalic.     Nose: Nose normal.     Mouth/Throat:     Mouth: Mucous membranes are moist.  Eyes:     Pupils: Pupils are equal, round, and reactive to light.  Cardiovascular:     Rate and Rhythm: Normal rate.     Pulses: Normal pulses.  Pulmonary:     Effort: Pulmonary effort is normal.   Abdominal:     General: Abdomen is flat.  Musculoskeletal:     Cervical back: Normal range of motion.     Comments: Tenderness over the right carpal tunnel area.  Patient has fingers flexed but able to extend them.  Patient has normal capillary refill  Skin:    General: Skin is warm.     Capillary Refill: Capillary refill takes less than 2 seconds.  Neurological:     General: No focal deficit present.     Mental Status: She is alert and oriented to person, place, and time.  Psychiatric:        Mood and Affect: Mood normal.        Behavior: Behavior normal.     ED Results / Procedures / Treatments   Labs (all labs ordered are listed, but only abnormal results are displayed) Labs Reviewed - No data to display  EKG None  Radiology DG Wrist Complete Right  Result Date: 11/22/2021 CLINICAL DATA:  Right wrist pain. EXAM: RIGHT WRIST - COMPLETE 3+ VIEW COMPARISON:  None Available. FINDINGS: There is no evidence of fracture or dislocation. There is no  evidence of arthropathy or other focal bone abnormality. Soft tissues are unremarkable. IMPRESSION: Negative. Electronically Signed   By: Anner Crete M.D.   On: 11/22/2021 18:29    Procedures Procedures    Medications Ordered in ED Medications  predniSONE (DELTASONE) tablet 60 mg (has no administration in time range)  ibuprofen (ADVIL) tablet 800 mg (has no administration in time range)    ED Course/ Medical Decision Making/ A&P                           Medical Decision Making Sacred Ashley Santana is a 31 y.o. female here presenting with right hand and wrist pain.  Suspicious for possible carpal tunnel syndrome.  Patient's x-ray did not show any fractures or arthritis.  We will start a course of steroids and pain medicine and will refer to hand surgery.   Problems Addressed: Carpal tunnel syndrome of right wrist: acute illness or injury  Amount and/or Complexity of Data Reviewed Radiology: ordered and independent  interpretation performed. Decision-making details documented in ED Course.  Risk Prescription drug management.    Final Clinical Impression(s) / ED Diagnoses Final diagnoses:  None    Rx / DC Orders ED Discharge Orders     None         Drenda Freeze, MD 11/22/21 1943

## 2021-11-22 NOTE — ED Triage Notes (Signed)
C/o right wrist pain started 2 weeks ago; difficulty writing and using hands; denies prior injury.

## 2022-04-05 IMAGING — DX DG ANKLE COMPLETE 3+V*R*
3 series · 3 of 3 positions shown · non-contrast
Comparison: None.

CLINICAL DATA: Status post trauma.

EXAM:
RIGHT ANKLE - COMPLETE 3+ VIEW

[ankle ap]
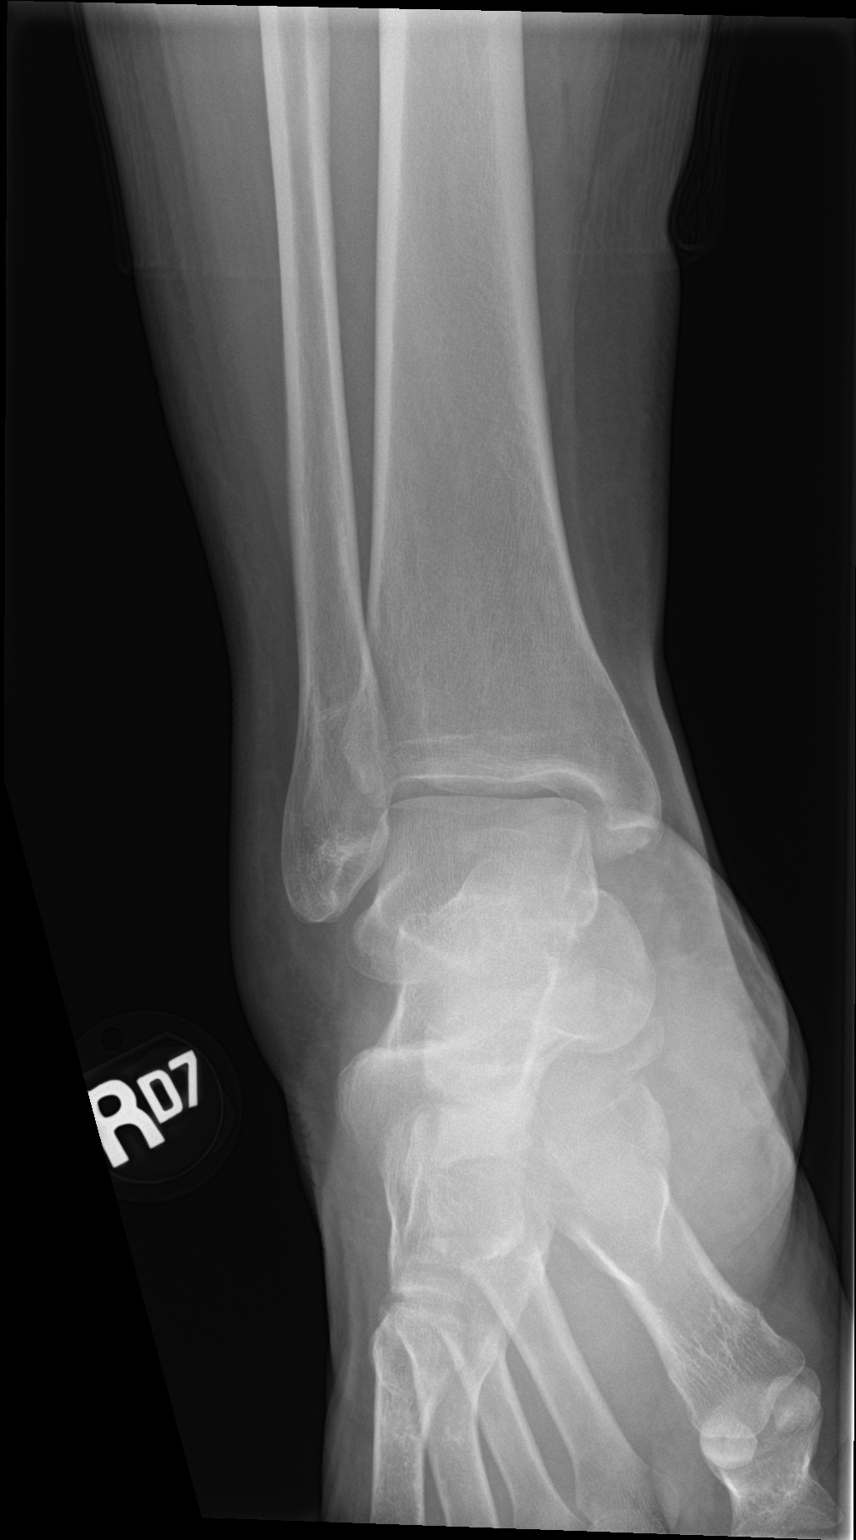

[ankle obl]
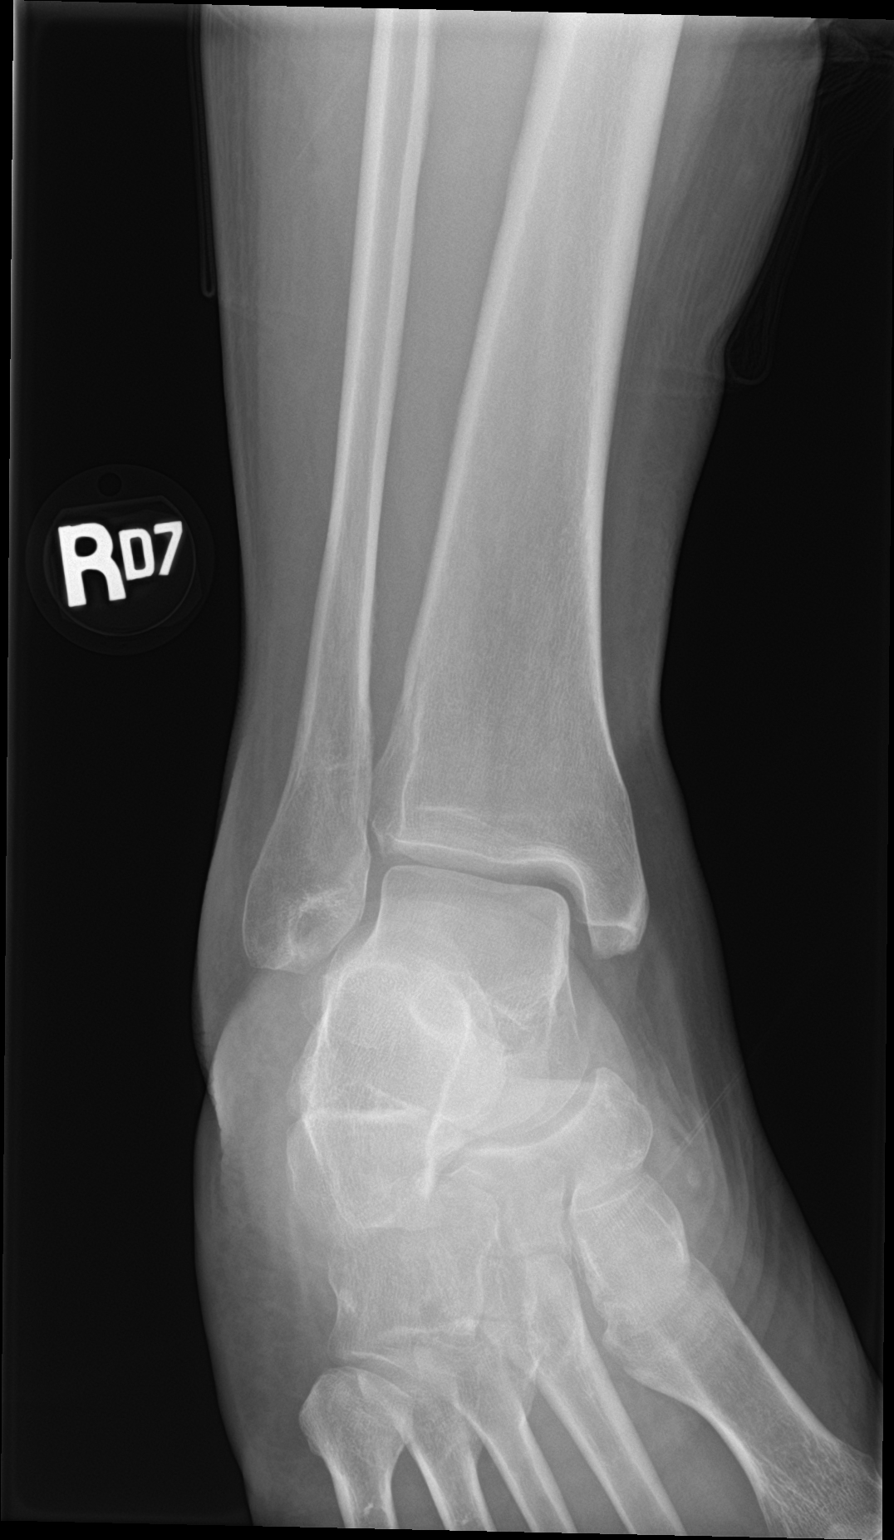

[ankle lat]
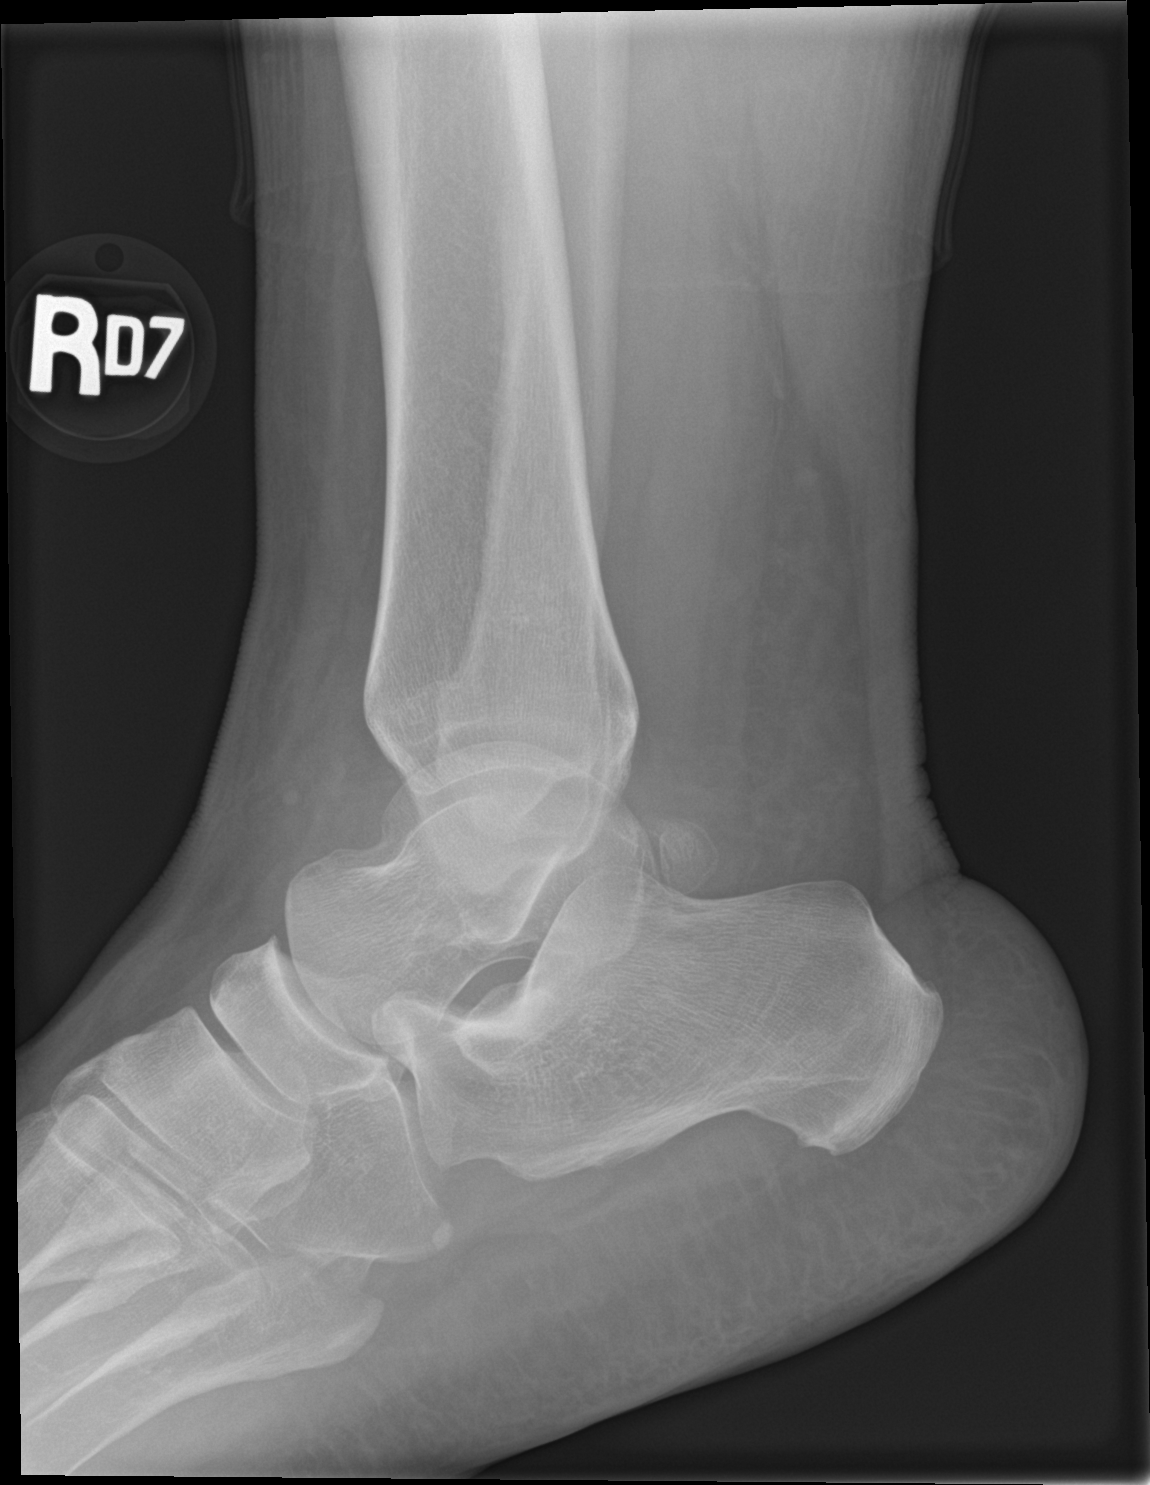

[3 of 3 positions shown; findings below may reference images not displayed]

FINDINGS: There is no evidence of fracture, dislocation, or joint effusion.
There is no evidence of arthropathy or other focal bone abnormality.
Mild diffuse soft tissue swelling is seen.
IMPRESSION: Mild diffuse soft tissue swelling without evidence of acute fracture
or dislocation.

## 2023-05-07 ENCOUNTER — Emergency Department (HOSPITAL_BASED_OUTPATIENT_CLINIC_OR_DEPARTMENT_OTHER): Payer: Self-pay

## 2023-05-07 ENCOUNTER — Encounter (HOSPITAL_BASED_OUTPATIENT_CLINIC_OR_DEPARTMENT_OTHER): Payer: Self-pay

## 2023-05-07 ENCOUNTER — Other Ambulatory Visit: Payer: Self-pay

## 2023-05-07 DIAGNOSIS — R051 Acute cough: Secondary | ICD-10-CM | POA: Insufficient documentation

## 2023-05-07 DIAGNOSIS — E119 Type 2 diabetes mellitus without complications: Secondary | ICD-10-CM | POA: Insufficient documentation

## 2023-05-07 DIAGNOSIS — R0789 Other chest pain: Secondary | ICD-10-CM | POA: Insufficient documentation

## 2023-05-07 LAB — RESP PANEL BY RT-PCR (RSV, FLU A&B, COVID)  RVPGX2
Influenza A by PCR: NEGATIVE
Influenza B by PCR: NEGATIVE
Resp Syncytial Virus by PCR: NEGATIVE
SARS Coronavirus 2 by RT PCR: NEGATIVE

## 2023-05-07 LAB — BASIC METABOLIC PANEL WITH GFR
Anion gap: 16 — ABNORMAL HIGH (ref 5–15)
BUN: 8 mg/dL (ref 6–20)
CO2: 21 mmol/L — ABNORMAL LOW (ref 22–32)
Calcium: 9.9 mg/dL (ref 8.9–10.3)
Chloride: 104 mmol/L (ref 98–111)
Creatinine, Ser: 1.01 mg/dL — ABNORMAL HIGH (ref 0.44–1.00)
GFR, Estimated: >60 mL/min (ref >60)
Glucose, Bld: 117 mg/dL — ABNORMAL HIGH (ref 70–99)
Potassium: 4.1 mmol/L (ref 3.5–5.1)
Sodium: 141 mmol/L (ref 135–145)

## 2023-05-07 LAB — CBC
HCT: 37.4 % (ref 36.0–46.0)
Hemoglobin: 12.3 g/dL (ref 12.0–15.0)
MCH: 28 pg (ref 26.0–34.0)
MCHC: 32.9 g/dL (ref 30.0–36.0)
MCV: 85 fL (ref 80.0–100.0)
Platelets: 265 10*3/uL (ref 150–400)
RBC: 4.4 MIL/uL (ref 3.87–5.11)
RDW: 13.7 % (ref 11.5–15.5)
WBC: 9.2 10*3/uL (ref 4.0–10.5)
nRBC: 0 % (ref 0.0–0.2)

## 2023-05-07 LAB — TROPONIN T, HIGH SENSITIVITY: Troponin T High Sensitivity: <15 ng/L (ref <19)

## 2023-05-07 NOTE — ED Triage Notes (Signed)
 Pt reports she is here today due to chest pain that started earlier this week. Pt also reports productive cough, sob. Pt reports hx Diabetes.

## 2023-05-08 ENCOUNTER — Emergency Department (HOSPITAL_BASED_OUTPATIENT_CLINIC_OR_DEPARTMENT_OTHER)
Admission: EM | Admit: 2023-05-08 | Discharge: 2023-05-08 | Disposition: A | Discharge status: Home / Self Care | Payer: Self-pay | Attending: Emergency Medicine | Admitting: Emergency Medicine

## 2023-05-08 ENCOUNTER — Other Ambulatory Visit: Payer: Self-pay

## 2023-05-08 DIAGNOSIS — R0789 Other chest pain: Secondary | ICD-10-CM

## 2023-05-08 DIAGNOSIS — R051 Acute cough: Secondary | ICD-10-CM

## 2023-05-08 HISTORY — DX: Type 2 diabetes mellitus without complications: E11.9

## 2023-05-08 MED ORDER — ALUM & MAG HYDROXIDE-SIMETH 200-200-20 MG/5ML PO SUSP
30.0000 mL | Freq: Once | ORAL | Status: AC
  Administered 2023-05-08: 30 mL via ORAL
  Filled 2023-05-08: qty 30

## 2023-05-08 MED ORDER — DEXAMETHASONE SODIUM PHOSPHATE 4 MG/ML IJ SOLN
4.0000 mg | Freq: Once | INTRAMUSCULAR | Status: AC
  Administered 2023-05-08: 4 mg via INTRAMUSCULAR
  Filled 2023-05-08: qty 1

## 2023-05-08 NOTE — ED Provider Notes (Signed)
 [place] [facility] AT [facility] Provider Note   CSN: [id number] Arrival date & time: 05/07/23  2000     History  Chief Complaint  Patient presents with   Chest Pain    Ashley Santana is a 33 y.o. female.  The history is provided by the patient.  Chest Pain Chest pain location: sternal notch with coughing. Pain radiates to:  Does not radiate Onset quality:  Gradual Timing:  Constant Progression:  Waxing and waning Chronicity:  New Context comment:  Coughing Relieved by:  Nothing Worsened by:  Nothing Ineffective treatments:  None tried Associated symptoms: cough   Associated symptoms: no fever, no numbness, no orthopnea, no palpitations, no PND, no shortness of breath and no weakness   Risk factors: no birth control, no prior DVT/PE and no surgery   Patient with DM presents with cough and pain at sternal notch with coughing.  No travel.  No OCP.  No leg pain.      Past Medical History:  Diagnosis Date   Diabetes mellitus without complication (HCC)    Headache    Migraines     Home Medications Prior to Admission medications   Medication Sig Start Date End Date Taking? Authorizing Provider  HYDROcodone -acetaminophen  (NORCO/VICODIN) 5-325 MG tablet Take 1 tablet by mouth every 6 (six) hours as needed. 11/22/21   Dalene Duck, MD  ibuprofen  (ADVIL ) 600 MG tablet Take 1 tablet (600 mg total) by mouth every 6 (six) hours. 05/01/19   Luan Rumpf, MD  oxyCODONE  (OXY IR/ROXICODONE ) 5 MG immediate release tablet Take 1 tablet (5 mg total) by mouth every 6 (six) hours as needed for severe pain. 05/01/19   Luan Rumpf, MD  predniSONE  (DELTASONE ) 20 MG tablet Take 60 mg daily x 2 days then 40 mg daily x 2 days then 20 mg daily x 2 days 11/22/21   Dalene Duck, MD  Prenatal Vit-Fe Fumarate-FA (PRENATAL MULTIVITAMIN) TABS tablet Take 1 tablet by mouth daily at 12 noon.    [provider]      Allergies    Patient has no known  allergies.    Review of Systems   Review of Systems  Constitutional:  Negative for fever.  Respiratory:  Positive for cough. Negative for shortness of breath.   Cardiovascular:  Positive for chest pain. Negative for palpitations, orthopnea and PND.  Neurological:  Negative for weakness and numbness.  All other systems reviewed and are negative.   Physical Exam Updated Vital Signs BP (!) 133/90   Pulse 85   Temp 98 F (36.7 C) (Oral)   Resp 20   Ht 5\' 2"  (1.575 m)   Wt 120.2 kg   LMP 04/18/2023   SpO2 97%   BMI 48.47 kg/m  Physical Exam Vitals and nursing note reviewed.  Constitutional:      General: She is not in acute distress.    Appearance: Normal appearance. She is well-developed.  HENT:     Head: Normocephalic and atraumatic.     Nose: Nose normal.  Eyes:     Pupils: Pupils are equal, round, and reactive to light.  Cardiovascular:     Rate and Rhythm: Normal rate and regular rhythm.     Pulses: Normal pulses.     Heart sounds: Normal heart sounds.  Pulmonary:     Effort: Pulmonary effort is normal. No respiratory distress.     Breath sounds: Normal breath sounds. No stridor. No wheezing, rhonchi or rales.  Abdominal:  General: Bowel sounds are normal. There is no distension.     Palpations: Abdomen is soft.     Tenderness: There is no abdominal tenderness. There is no guarding or rebound.  Musculoskeletal:        General: Normal range of motion.     Cervical back: Neck supple.  Skin:    General: Skin is dry.     Capillary Refill: Capillary refill takes less than 2 seconds.     Findings: No erythema or rash.  Neurological:     General: No focal deficit present.     Mental Status: She is alert and oriented to person, place, and time.     Deep Tendon Reflexes: Reflexes normal.  Psychiatric:        Mood and Affect: Mood normal.     ED Results / Procedures / Treatments   Labs (all labs ordered are listed, but only abnormal results are  displayed) Results for orders placed or performed during the hospital encounter of 05/08/23  Resp panel by RT-PCR (RSV, Flu A&B, Covid) Anterior Nasal Swab   Collection Time: 05/07/23  8:12 PM   Specimen: Anterior Nasal Swab  Result Value Ref Range   SARS Coronavirus 2 by RT PCR NEGATIVE NEGATIVE   Influenza A by PCR NEGATIVE NEGATIVE   Influenza B by PCR NEGATIVE NEGATIVE   Resp Syncytial Virus by PCR NEGATIVE NEGATIVE  Basic metabolic panel   Collection Time: 05/07/23  8:12 PM  Result Value Ref Range   Sodium 141 135 - 145 mmol/L   Potassium 4.1 3.5 - 5.1 mmol/L   Chloride 104 98 - 111 mmol/L   CO2 21 (L) 22 - 32 mmol/L   Glucose, Bld 117 (H) 70 - 99 mg/dL   BUN 8 6 - 20 mg/dL   Creatinine, Ser 1.61 (H) 0.44 - 1.00 mg/dL   Calcium 9.9 8.9 - 09.6 mg/dL   GFR, Estimated >04 >54 mL/min   Anion gap 16 (H) 5 - 15  CBC   Collection Time: 05/07/23  8:12 PM  Result Value Ref Range   WBC 9.2 4.0 - 10.5 K/uL   RBC 4.40 3.87 - 5.11 MIL/uL   Hemoglobin 12.3 12.0 - 15.0 g/dL   HCT 09.8 11.9 - 14.7 %   MCV 85.0 80.0 - 100.0 fL   MCH 28.0 26.0 - 34.0 pg   MCHC 32.9 30.0 - 36.0 g/dL   RDW 82.9 56.2 - 13.0 %   Platelets 265 150 - 400 K/uL   nRBC 0.0 0.0 - 0.2 %  Troponin T, High Sensitivity   Collection Time: 05/07/23  8:12 PM  Result Value Ref Range   Troponin T High Sensitivity <15 <19 ng/L   DG Chest 2 View Result Date: 05/07/2023 CLINICAL DATA:  chest pain EXAM: CHEST - 2 VIEW COMPARISON:  None Available. FINDINGS: Cardiac silhouette is unremarkable. No pneumothorax or pleural effusion. The lungs are clear. The visualized skeletal structures are unremarkable. IMPRESSION: No acute cardiopulmonary process. Electronically Signed   By: Sydell Eva M.D.   On: 05/07/2023 20:33     EKG  Date: 05/08/2023  Rate: 87   Rhythm: normal sinus rhythm  QRS Axis: normal  Intervals: normal  ST/T Wave abnormalities: normal  Conduction Disutrbances: none  Narrative Interpretation: low  voltage      Radiology DG Chest 2 View Result Date: 05/07/2023 CLINICAL DATA:  chest pain EXAM: CHEST - 2 VIEW COMPARISON:  None Available. FINDINGS: Cardiac silhouette is unremarkable. No pneumothorax or pleural  effusion. The lungs are clear. The visualized skeletal structures are unremarkable. IMPRESSION: No acute cardiopulmonary process. Electronically Signed   By: Sydell Eva M.D.   On: 05/07/2023 20:33    Procedures Procedures    Medications Ordered in ED Medications  dexamethasone  (DECADRON ) injection 4 mg (4 mg Intramuscular Given 05/08/23 0023)  alum & mag hydroxide-simeth (MAALOX/MYLANTA) 200-200-20 MG/5ML suspension 30 mL (30 mLs Oral Given 05/08/23 0023)    ED Course/ Medical Decision Making/ A&P                                 Medical Decision Making Patient with pain with coughing   Amount and/or Complexity of Data Reviewed External Data Reviewed: notes.    Details: Previous notes reviewed  Labs: ordered.    Details: Negative covid and flu, troponin < 15 normal.  Normal white count 9.2, normal hemoglobin 12.3, normal platelets.  Normal sodium 141, normal potassium 4.1, elevated creatinine 1.01  Radiology: ordered and independent interpretation performed.    Details: Normal CXR ECG/medicine tests: ordered and independent interpretation performed. Decision-making details documented in ED Course.  Risk OTC drugs. Prescription drug management. Risk Details: Symptoms consistent with chest wall pain from cough.  Perc negative wells 0 highly doubt PE in this low risk patient.  Ruled out for MI.  HEART score 1 very low risk for MACE.  Steroids given.  Stable for discharge with close follow up. Strict returns.     Final Clinical Impression(s) / ED Diagnoses Final diagnoses:  Acute cough   No signs of systemic illness or infection. The patient is nontoxic-appearing on exam and vital signs are within normal limits.  I have reviewed the triage vital signs and the  nursing notes. Pertinent labs & imaging results that were available during my care of the patient were reviewed by me and considered in my medical decision making (see chart for details). After history, exam, and medical workup I feel the patient has been appropriately medically screened and is safe for discharge home. Pertinent diagnoses were discussed with the patient. Patient was given return precautions.    Rx / DC Orders ED Discharge Orders     None         Jakarri Lesko, MD 05/08/23 [phone]
